# Patient Record
Sex: Male | Born: 1940
Health system: Southern US, Community
[De-identification: ages and names within clinical notes are randomized; demographics above are authoritative.]

## PROBLEM LIST (undated history)

## (undated) DIAGNOSIS — M1990 Unspecified osteoarthritis, unspecified site: Secondary | ICD-10-CM

## (undated) DIAGNOSIS — Z9289 Personal history of other medical treatment: Secondary | ICD-10-CM

## (undated) DIAGNOSIS — K5790 Diverticulosis of intestine, part unspecified, without perforation or abscess without bleeding: Secondary | ICD-10-CM

## (undated) DIAGNOSIS — E785 Hyperlipidemia, unspecified: Secondary | ICD-10-CM

## (undated) DIAGNOSIS — D619 Aplastic anemia, unspecified: Secondary | ICD-10-CM

## (undated) DIAGNOSIS — N402 Nodular prostate without lower urinary tract symptoms: Secondary | ICD-10-CM

## (undated) DIAGNOSIS — D595 Paroxysmal nocturnal hemoglobinuria [Marchiafava-Micheli]: Secondary | ICD-10-CM

## (undated) HISTORY — PX: COLONOSCOPY: SHX174

## (undated) HISTORY — PX: APPENDECTOMY: SHX54

## (undated) HISTORY — DX: Hyperlipidemia, unspecified: E78.5

## (undated) HISTORY — PX: TOTAL HIP ARTHROPLASTY: SHX124

## (undated) HISTORY — PX: JOINT REPLACEMENT: SHX530

## (undated) HISTORY — PX: SHOULDER ARTHROSCOPY: SHX128

## (undated) HISTORY — DX: Nodular prostate without lower urinary tract symptoms: N40.2

## (undated) HISTORY — PX: TONSILLECTOMY: SUR1361

## (undated) HISTORY — PX: KNEE SURGERY: SHX244

---

## 2003-03-17 ENCOUNTER — Emergency Department (HOSPITAL_COMMUNITY): Admission: EM | Admit: 2003-03-17 | Discharge: 2003-03-17 | Payer: Self-pay | Admitting: Emergency Medicine

## 2003-07-15 ENCOUNTER — Encounter: Payer: Self-pay | Admitting: Orthopedic Surgery

## 2003-07-18 ENCOUNTER — Inpatient Hospital Stay (HOSPITAL_COMMUNITY): Admission: RE | Admit: 2003-07-18 | Discharge: 2003-07-21 | Payer: Self-pay | Admitting: Orthopedic Surgery

## 2003-07-18 ENCOUNTER — Encounter: Payer: Self-pay | Admitting: Orthopedic Surgery

## 2003-08-02 ENCOUNTER — Ambulatory Visit: Admission: RE | Admit: 2003-08-02 | Discharge: 2003-08-02 | Payer: Self-pay | Admitting: Orthopedic Surgery

## 2003-10-02 ENCOUNTER — Ambulatory Visit (HOSPITAL_COMMUNITY): Admission: RE | Admit: 2003-10-02 | Discharge: 2003-10-02 | Payer: Self-pay | Admitting: Orthopedic Surgery

## 2003-10-10 ENCOUNTER — Encounter: Admission: RE | Admit: 2003-10-10 | Discharge: 2003-10-10 | Payer: Self-pay | Admitting: Orthopedic Surgery

## 2003-10-23 ENCOUNTER — Encounter: Admission: RE | Admit: 2003-10-23 | Discharge: 2003-10-23 | Payer: Self-pay | Admitting: Orthopedic Surgery

## 2003-11-12 ENCOUNTER — Encounter: Admission: RE | Admit: 2003-11-12 | Discharge: 2003-11-12 | Payer: Self-pay | Admitting: Orthopedic Surgery

## 2004-05-26 ENCOUNTER — Inpatient Hospital Stay (HOSPITAL_COMMUNITY): Admission: RE | Admit: 2004-05-26 | Discharge: 2004-05-30 | Payer: Self-pay | Admitting: Orthopedic Surgery

## 2004-10-13 ENCOUNTER — Inpatient Hospital Stay (HOSPITAL_COMMUNITY): Admission: RE | Admit: 2004-10-13 | Discharge: 2004-10-17 | Payer: Self-pay | Admitting: Orthopedic Surgery

## 2004-10-14 ENCOUNTER — Ambulatory Visit: Payer: Self-pay | Admitting: Physical Medicine & Rehabilitation

## 2008-12-21 ENCOUNTER — Emergency Department (HOSPITAL_COMMUNITY): Admission: EM | Admit: 2008-12-21 | Discharge: 2008-12-21 | Payer: Self-pay | Admitting: Emergency Medicine

## 2009-02-21 ENCOUNTER — Encounter: Payer: Self-pay | Admitting: Internal Medicine

## 2009-03-28 ENCOUNTER — Encounter: Admission: RE | Admit: 2009-03-28 | Discharge: 2009-03-28 | Payer: Self-pay | Admitting: Family Medicine

## 2009-05-22 ENCOUNTER — Ambulatory Visit: Payer: Self-pay | Admitting: Internal Medicine

## 2009-05-22 DIAGNOSIS — J4489 Other specified chronic obstructive pulmonary disease: Secondary | ICD-10-CM | POA: Insufficient documentation

## 2009-05-22 DIAGNOSIS — J449 Chronic obstructive pulmonary disease, unspecified: Secondary | ICD-10-CM

## 2009-05-22 DIAGNOSIS — E785 Hyperlipidemia, unspecified: Secondary | ICD-10-CM

## 2011-02-26 NOTE — Discharge Summary (Signed)
NAMEJAYAN, [name] NO.:  [id number]   MEDICAL RECORD NO.:  [id number]                   PATIENT TYPE:  INP   LOCATION:  5019                                 FACILITY:  [facility]   PHYSICIAN:  Burnard Bunting, M.D.                 DATE OF BIRTH:  01/21/1941   DATE OF ADMISSION:  07/18/2003  DATE OF DISCHARGE:  07/21/2003                                 DISCHARGE SUMMARY   DISCHARGE DIAGNOSIS:  Right knee arthritis.   SECONDARY DIAGNOSIS:  None.   OPERATION/PROCEDURE:  Right total knee replacement.   HOSPITAL COURSE:  Vincent Jensen is a 70 year old patient with right knee  arthritis who underwent a right total knee replacement on July 18, 2003.  The patient tolerated the procedure well without immediate complications.  He was noted to have intact neurovascular function to the right foot post  procedure.  The patient was started on Coumadin for DVT prophylaxis and he  was started on physical therapy and CPM machine for mobilization.  Postoperative radiographs showed good position and alignment of components.  Hematocrit on postop day #3 was 28.6.  The patient made excellent progress  and was discharged home, July 21, 2003, in good condition.   FOLLOWUP:  He will follow up with me in one week.   DISCHARGE INSTRUCTIONS:  He will be weightbearing as tolerated and will keep  the incision dry.   DISCHARGE MEDICATIONS:  Discharge medications include Coumadin and Percocet,  both preadmission medications.                                                Burnard Bunting, M.D.    GSD/MEDQ  D:  08/27/2003  T:  08/27/2003  Job:  [id number]

## 2011-02-26 NOTE — Op Note (Signed)
NAMEKIMBALL, APPLEBY NO.:  [id number]   MEDICAL RECORD NO.:  [id number]                   PATIENT TYPE:  INP   LOCATION:  5030                                 FACILITY:  [facility]   PHYSICIAN:  Burnard Bunting, M.D.                 DATE OF BIRTH:  1940/11/02   DATE OF PROCEDURE:  05/26/2004  DATE OF DISCHARGE:  05/30/2004                                 OPERATIVE REPORT   PREOPERATIVE DIAGNOSIS:  Right hip arthritis.   POSTOPERATIVE DIAGNOSIS:  Right hip arthritis.   PROCEDURE:  Right total hip replacement.   SURGEON:  Burnard Bunting, M.D.   ASSISTANT:  Jerolyn Shin. Tresa Res, M.D.   ANESTHESIA:  General endotracheal anesthesia.   ESTIMATED BLOOD LOSS:  600 mL.   COMPONENTS:  Accolade size 4 femur, +12 head, 54 mm cup, with 32 mm head and  Crossfire poly.   PROCEDURE IN DETAIL:  The patient was brought to the operating room where  general endotracheal anesthesia was induced.  Preoperative antibiotics were  administered.  The patient was placed in the lateral decubitus position with  the left axilla and left peroneal nerve well padded.  The patient's right  hip, leg, and foot were prepped with DuraPrep solution and draped in a  sterile manner.  The operative field was covered with Ioban.  An incision  was made over the greater trochanter.  The skin and subcutaneous tissue were  sharply divided.  The fascia lata was split over the trochanter.  The  sciatic nerve was palpated and protected at all times during the remaining  portion of the case.  The gluteus maximus muscle was split bluntly along the  direction of its fibers.  The piriformis tendon was tagged.  The external  rotators were removed from the capsule.  The capsule was then split and  tagged with #1 Ethibond suture.  The hip was dislocated at this time.  The  neck was then cut with the reciprocating saw.  The acetabular labrum was  excised, the anterior acetabular retractors were placed.  The  acetabulum was  reamed in approximately 45 degrees of abduction and 20 degrees of  anteversion.  A trial 0 degree liner was placed.  Excellent press fit was  achieved with the cup.  At this time, the femur was prepared by broaching  with an Accolade broach and then reduction was performed with a size 1 in  position.  Anteversion was subsequently changed to increase posterior  instability at that time.  Following change of anteversion and retrialing  with a size 2 Accolade prosthesis, the patient was noted to have stability  in full extension with external rotation and 90 degrees of hip flexion and  60 degrees of internal rotation.  The trial liner was removed and broaching  to a size 4 was performed.  The ceramic cup was then placed into position  and then the  true stem was placed.  With the stem in position, stability  parameters changed with increased anterior instability in external rotation  and extension.  A 10 degree liner was placed and a longer neck length was  chosen.  With a +12 head and a 10 degree liner, excellent stability was  achieved with external rotation, full extension, and anterior force.  At  this time, a 10 mm poly liner was placed and a +12 head was placed onto the  stem of the femur.  Good reduction parameters were maintained.  At this  time, the incision was thoroughly irrigated, bleeding points were accounted  for using electrocautery.  The posterior capsular split was closed using #1  Ethibond suture.  The sciatic nerve was again palpated and found to be  intact.  Interoperative x-rays demonstrated good fill of the canal and good  component position and restoration of leg lengths where the right leg was  shorter than the left.  The fascia lata was closed using #1 Vicryl suture.  The skin was closed  using interrupted inverted 2-0 Vicryl suture and skin staples.  An  impervious dressing was placed.  The patient tolerated the procedure well  without immediate  complications.                                               Burnard Bunting, M.D.    GSD/MEDQ  D:  06/15/2004  T:  06/15/2004  Job:  [id number]

## 2011-02-26 NOTE — Discharge Summary (Signed)
[name], [name] NO.:  [id number]   MEDICAL RECORD NO.:  [id number]          PATIENT TYPE:  INP   LOCATION:  5032                         FACILITY:  [facility]   PHYSICIAN:  Burnard Bunting, M.D.    DATE OF BIRTH:  Nov 27, 1940   DATE OF ADMISSION:  10/13/2004  DATE OF DISCHARGE:  10/17/2004                                 DISCHARGE SUMMARY   DISCHARGE DIAGNOSES:  Left hip arthritis   SECONDARY DIAGNOSIS:  History of bilateral knee replacement and right hip  arthritis with right hip replacement.   OPERATIONS/PROCEDURES/TREATMENT:  Left hip replacement performed on October 13, 2004.   HOSPITAL COURSE:  Vincent Jensen is a 70 year old patient with left hip  arthritis. He underwent left total replacement on October 13, 2004. The  patient was started on Coumadin for deep vein thrombosis prophylaxis.  Postoperative neurovascular function was intact in the left lower extremity.  Leg lengths were approximately equal. Coumadin was started for deep vein  thrombosis prophylaxis. The patient was started with physical therapy,  mobilizing weight bearing as tolerated. Had an unremarkable recovery.  Incision was intact on postoperative day 2.   DISPOSITION:  The patient is discharged home in good condition with Percocet  and Robaxin as well as Coumadin for discharge medications.   FOLLOW UP:  He will followup with me in a week for suture removal.      GSD/MEDQ  D:  12/18/2004  T:  12/20/2004  Job:  [id number]

## 2011-02-26 NOTE — Discharge Summary (Signed)
NAMEPATTY, LOPEZGARCIA NO.:  [id number]   MEDICAL RECORD NO.:  [id number]          PATIENT TYPE:  INP   LOCATION:  5030                         FACILITY:  [facility]   PHYSICIAN:  Burnard Bunting, M.D.    DATE OF BIRTH:  [birth year]   DATE OF ADMISSION:  05/26/2004  DATE OF DISCHARGE:  05/30/2004                                 DISCHARGE SUMMARY   DISCHARGE DIAGNOSES:  Right hip arthritis.   SECONDARY DIAGNOSES:  History of deep venous thrombosis.   OPERATION/PROCEDURE:  Right hip replacement.   HISTORY OF PRESENT ILLNESS:  Carson Mceuen is a 70 year old patient with  end-stage arthritis of his right hip.  He underwent right total hip  replacement May 26, 2004.  Postoperative following the procedure patient  was noted to have equal leg lengths and intact dorsiflexion and plantar  flexion.  He was mobilized with physical therapy.  His leg lengths were  equal.  Hematocrit was 27 on May 28, 2004.  He was started on Coumadin  for DVT prophylaxis.  INR was 1.4 on postoperative day #2.  Patient had  otherwise uneventful recovery.  He mobilized well with physical therapy.  Incision was intact on postoperative day #3.  He had no calf tenderness.  He  was continued on Lovenox and Coumadin until his Coumadin was therapeutic.  He had an otherwise uneventful recovery.  He is discharged home in good  condition.  Will follow up with me in seven days for suture removal.   DISCHARGE MEDICATIONS:  1.  Lovenox.  2.  Coumadin.  3.  Robaxin.  4.  Percocet for pain.  5.  Preoperative medications.       GSD/MEDQ  D:  07/15/2004  T:  07/15/2004  Job:  [id number]

## 2011-02-26 NOTE — Op Note (Signed)
Vincent Jensen, Vincent Jensen NO.:  [id number]   MEDICAL RECORD NO.:  [id number]                   PATIENT TYPE:  INP   LOCATION:  5019                                 FACILITY:  [facility]   PHYSICIAN:  Burnard Bunting, M.D.                 DATE OF BIRTH:  28-Dec-1940   DATE OF PROCEDURE:  07/18/2003  DATE OF DISCHARGE:                                 OPERATIVE REPORT   PREOPERATIVE DIAGNOSIS:  Right knee arthritis.   POSTOPERATIVE DIAGNOSIS:  Right knee arthritis.   OPERATION PERFORMED:  Right total knee arthroplasty.   SURGEON:  Burnard Bunting, M.D.   ASSISTANT:  Jerolyn Shin. Tresa Res, M.D.   ANESTHESIA:  Epidural.   ESTIMATED BLOOD LOSS:  .   DRAINS:  Hemovac times one.   TOURNIQUET TIME:  125 minutes at .   DESCRIPTION OF PROCEDURE:  The patient was brought to the operating room  where epidural anesthesia was induced.  Preoperative intravenous antibiotics  were administered.  The right leg including the foot was prepped with  DuraPrep solution and draped in a sterile manner.  Vincent Jensen was used to cover  the operative field.  The patient had a prior medial meniscectomy incision.  This was incorporated into the anterior approach to the knee.  The leg was  elevated and exsanguinated with the Esmarch wrap.  Tourniquet was inflated.  A curvilinear anterior incision was made, curving on the medial aspect of  the patella to incorporate the entire incision. The skin and subcutaneous  tissue were sharply divided.  Median parapatellar arthrotomy was made.  The  precise location of this arthrotomy was marked with a #1 Vicryl suture.  The  patella was everted.  The fat pad was excised.  The lateral patellofemoral  ligament was resected.  The tissue on the anterior superior distal aspect of  the femoral shaft was removed.  Osteophytes were also removed.  At this time  the ACL and PCL were released.  Anterior horns of the medial and lateral  menisci were also  released.  The medial sleeve of the soft tissue including  superficial MCL was released back to the semimembranosus bursa.  At this  time the intramedullary alignment was placed into the femur.  A 12 mm  resection was made off of the femoral condyle in 5 degrees of valgus.  The  cutting block was then applied, parallel to the transepicondylar axis.  The  anterior, posterior and Chamfer cuts were then made to a size 9.  At this  time the tibia was prepared.  The PCL was resected.  The intramedullary  alignment was used on the tibial shaft.  A 12 mm resection was made off of  the least affected lateral tibial plateau.  This cut approximately 90% of  the medial tibial plateau.  There was an area that was 5 mm by 1 cm which  was eburnated bone  which was not cut.  It was on the posteromedial aspect of  the tibia.  Holes were drilled into this hardened bone for cement  penetration.  Following this, resection of the tibia, the box cut was made  on the femur, posterior capsular release was performed using a blunt Cobb  elevator.  The patella was then cut in free hand fashion and sized to a size  7.  Trial components were then placed including a 10 spacer.  The knee  achieved about 3 degrees of hyperextension, full flexion with no lift off  and excellent patella tracking using no thumbs technique.  Trial components  were removed.  The cut bony surfaces were irrigated and then the components  including a size 9 femur, size 9 tibial, 7 patella and 10 mm spacer were  placed.  Excess cement was removed.  Cement hardening was allowed to occur.  Range of motion, stability and alignment were all satisfactory.  At this  time the true 10 spacer was placed.  The patient tolerated the procedure  well without immediate complications. The tourniquet was released.  Bleeding  points encountered were controlled using electrocautery.  The arthrotomy was  closed over a Hemovac drain using interrupted inverted #1  Vicryl suture,  followed by interrupted inverted 2-0 Vicryl suture and skin staples.  The  Hemovac drain was clamped and 20 mL of 0.25% Marcaine with epinephrine, 8 mg  of morphine and 0.45mL of clonidine were injected into the knee.  The  patient was then placed into a bulky dressing and knee immobilizer.  The  patient tolerated the procedure well without immediate complications.                                               Burnard Bunting, M.D.    GSD/MEDQ  D:  07/18/2003  T:  07/18/2003  Job:  [id number]

## 2011-02-26 NOTE — Op Note (Signed)
Vincent Jensen, Vincent Jensen NO.:  [id number]   MEDICAL RECORD NO.:  [id number]          PATIENT TYPE:  INP   LOCATION:  5032                         FACILITY:  [facility]   PHYSICIAN:  Burnard Bunting, M.D.    DATE OF BIRTH:  11-Oct-1941   DATE OF PROCEDURE:  10/13/2004  DATE OF DISCHARGE:  10/17/2004                                 OPERATIVE REPORT   PREOPERATIVE DIAGNOSIS:  Left hip arthritis.   POSTOPERATIVE DIAGNOSIS:  Left hip arthritis.   PROCEDURE:  Left total hip replacement.   SURGEON:  Burnard Bunting, M.D.   ASSISTANT:  Jerolyn Shin. Tresa Res, M.D.   ANESTHESIA:  General endotracheal.   ESTIMATED BLOOD LOSS:  500 mL.   DRAINS:  None.   COMPONENTS:  Accolade press-fit stem, press-fit cup, Crossfire polyethylene  liner, cobalt chrome head.   DESCRIPTION OF PROCEDURE:  The patient was brought to the operating room  where general endotracheal anesthesia was induced, preoperative IV  antibiotics were administered.  The patient was placed in the lateral  decubitus position with the left hip facing up.  Peroneal nerve and axilla  were well padded. The left leg, hip, foot and ankle were prepped with  DuraPrep solution and draped in a sterile manner.  [name] was used to cover  the operative field.  The anatomy of the hip was palpated, including the  posterior, anterior and lateral margins.  Skin and subcutaneous tissues were  sharply divided, fascia lata was encountered and divided distally and  proximally. Gluteus maximus muscle fibers were split longitudinally.  Bleeding points encountered and controlled using electrocautery.  Sciatic  nerve was palpated at this time and protected at all times during the  remaining portion of the case.   Charnley retractor was placed, femoris tendon was tagged and retracted,  external rotators were then peeled off the capsule and retracted  posteriorly.  The capsule was then T'd and tagged. Using a broach template,  the femoral neck  cut was performed, with an oscillating saw approximately 1  fingerbreadth to the lesser tuberosity according to preoperative templating.  Lateral starting point for reaming was obtained.   At this time, the intra-acetabular retractor was placed, the labrum was  incised, fovea was excised, reaming was performed to approximately 45  degrees of abduction and 25 degrees of hip anteversion.  Once solid, full  rim contact was obtained, the trial liner was placed and the femur was then  broached.  Intraoperative x-rays demonstrated good canal fill with the  broach.  Reduction was then performed.  Good stability was achieved in full  extension and external rotation, position __________ as well as 90 degrees  of hip flexion, 10 degrees of abduction, and 60 degrees of internal  rotation.   At this time, trial components were removed, true components were then  placed, beginning with a press-fit cup with excellent purchase obtained. A  10 degrees liner was then placed.  Accolade stem was placed and a +8 neck  was required in order to maximize tissue, off-set tension for maximum  stability. Stability parameters remained intact.  At this time,  incision was  thoroughly irrigated with pulse lavage solution. Sciatic nerve was again  palpated and found to be intact.  Capsule was closed using #1 Ethibond  suture.  Piriformis tendon was tagged back to the capsule with free needle.  Hemovac drain was placed. Fascia lata was closed using interrupted unfolded  #1 Vicryl suture, followed by interrupted, inverted 2-0 Vicryl suture and  skin staples to reapproximate the skin edges.  Impervious dressing was  placed. The patient had equal leg lengths in dorsiflexion at the end of the  case.       GSD/MEDQ  D:  11/01/2004  T:  11/01/2004  Job:  [id number]

## 2011-08-12 DIAGNOSIS — D595 Paroxysmal nocturnal hemoglobinuria [Marchiafava-Micheli]: Secondary | ICD-10-CM | POA: Insufficient documentation

## 2011-09-29 DIAGNOSIS — D619 Aplastic anemia, unspecified: Secondary | ICD-10-CM | POA: Insufficient documentation

## 2011-10-13 DIAGNOSIS — D596 Hemoglobinuria due to hemolysis from other external causes: Secondary | ICD-10-CM | POA: Diagnosis not present

## 2011-10-13 DIAGNOSIS — R51 Headache: Secondary | ICD-10-CM | POA: Diagnosis present

## 2011-10-13 DIAGNOSIS — Z96659 Presence of unspecified artificial knee joint: Secondary | ICD-10-CM | POA: Diagnosis not present

## 2011-10-13 DIAGNOSIS — D6189 Other specified aplastic anemias and other bone marrow failure syndromes: Secondary | ICD-10-CM | POA: Diagnosis present

## 2011-10-13 DIAGNOSIS — D619 Aplastic anemia, unspecified: Secondary | ICD-10-CM | POA: Diagnosis not present

## 2011-10-19 DIAGNOSIS — D61818 Other pancytopenia: Secondary | ICD-10-CM | POA: Diagnosis not present

## 2011-10-21 DIAGNOSIS — D619 Aplastic anemia, unspecified: Secondary | ICD-10-CM | POA: Diagnosis not present

## 2011-10-21 DIAGNOSIS — D61818 Other pancytopenia: Secondary | ICD-10-CM | POA: Diagnosis not present

## 2011-10-25 DIAGNOSIS — D619 Aplastic anemia, unspecified: Secondary | ICD-10-CM | POA: Diagnosis not present

## 2011-10-25 DIAGNOSIS — D61818 Other pancytopenia: Secondary | ICD-10-CM | POA: Diagnosis not present

## 2011-10-28 DIAGNOSIS — D619 Aplastic anemia, unspecified: Secondary | ICD-10-CM | POA: Diagnosis not present

## 2011-10-28 DIAGNOSIS — D61818 Other pancytopenia: Secondary | ICD-10-CM | POA: Diagnosis not present

## 2011-10-28 DIAGNOSIS — D596 Hemoglobinuria due to hemolysis from other external causes: Secondary | ICD-10-CM | POA: Diagnosis not present

## 2011-11-03 DIAGNOSIS — D61818 Other pancytopenia: Secondary | ICD-10-CM | POA: Diagnosis not present

## 2011-11-03 DIAGNOSIS — D619 Aplastic anemia, unspecified: Secondary | ICD-10-CM | POA: Diagnosis not present

## 2011-11-03 DIAGNOSIS — D596 Hemoglobinuria due to hemolysis from other external causes: Secondary | ICD-10-CM | POA: Diagnosis not present

## 2011-11-09 DIAGNOSIS — D61818 Other pancytopenia: Secondary | ICD-10-CM | POA: Diagnosis not present

## 2011-11-09 DIAGNOSIS — D596 Hemoglobinuria due to hemolysis from other external causes: Secondary | ICD-10-CM | POA: Diagnosis not present

## 2011-11-15 DIAGNOSIS — D596 Hemoglobinuria due to hemolysis from other external causes: Secondary | ICD-10-CM | POA: Diagnosis not present

## 2011-11-22 DIAGNOSIS — D596 Hemoglobinuria due to hemolysis from other external causes: Secondary | ICD-10-CM | POA: Diagnosis not present

## 2011-11-30 DIAGNOSIS — D596 Hemoglobinuria due to hemolysis from other external causes: Secondary | ICD-10-CM | POA: Diagnosis not present

## 2011-12-06 DIAGNOSIS — D596 Hemoglobinuria due to hemolysis from other external causes: Secondary | ICD-10-CM | POA: Diagnosis not present

## 2011-12-07 ENCOUNTER — Other Ambulatory Visit: Payer: Self-pay

## 2011-12-07 ENCOUNTER — Encounter (HOSPITAL_COMMUNITY): Payer: Self-pay | Admitting: *Deleted

## 2011-12-07 ENCOUNTER — Observation Stay (HOSPITAL_COMMUNITY)
Admission: EM | Admit: 2011-12-07 | Discharge: 2011-12-08 | Disposition: A | Discharge status: Home / Self Care | Payer: 59 | Attending: Family Medicine | Admitting: Family Medicine

## 2011-12-07 ENCOUNTER — Emergency Department (HOSPITAL_COMMUNITY): Payer: 59

## 2011-12-07 DIAGNOSIS — M47812 Spondylosis without myelopathy or radiculopathy, cervical region: Secondary | ICD-10-CM | POA: Insufficient documentation

## 2011-12-07 DIAGNOSIS — R079 Chest pain, unspecified: Secondary | ICD-10-CM | POA: Diagnosis not present

## 2011-12-07 DIAGNOSIS — E785 Hyperlipidemia, unspecified: Secondary | ICD-10-CM | POA: Insufficient documentation

## 2011-12-07 DIAGNOSIS — D6189 Other specified aplastic anemias and other bone marrow failure syndromes: Secondary | ICD-10-CM | POA: Insufficient documentation

## 2011-12-07 DIAGNOSIS — M25519 Pain in unspecified shoulder: Secondary | ICD-10-CM | POA: Insufficient documentation

## 2011-12-07 DIAGNOSIS — R0789 Other chest pain: Principal | ICD-10-CM | POA: Insufficient documentation

## 2011-12-07 HISTORY — DX: Aplastic anemia, unspecified: D61.9

## 2011-12-07 HISTORY — DX: Unspecified osteoarthritis, unspecified site: M19.90

## 2011-12-07 HISTORY — DX: Paroxysmal nocturnal hemoglobinuria (Marchiafava-Micheli): D59.5

## 2011-12-07 HISTORY — DX: Diverticulosis of intestine, part unspecified, without perforation or abscess without bleeding: K57.90

## 2011-12-07 MED ORDER — IBUPROFEN 200 MG PO TABS: 600.0000 mg | ORAL_TABLET | Freq: Once | ORAL | Status: DC

## 2011-12-07 MED ORDER — IBUPROFEN 200 MG PO TABS
ORAL_TABLET | ORAL | Status: AC
  Filled 2011-12-07: qty 3

## 2011-12-07 NOTE — ED Notes (Signed)
Pt referred by oncologist for ongoing CP/shoulder pain.  Taken to EKG room by this RN.

## 2011-12-07 NOTE — ED Notes (Addendum)
C/o R pectoral muscle pain, R chest & R shoulder), worse with movement and turning, guarding movement, relates to moving a large oriental rug on Sunday, pain onset on Sunday, denies other sx, has some chronic sx r/t to BNP that makes it difficult to differentiate b/w chronic and new dizziness. Alert, NAD, calm, interactive,skin W&D, resps e/u, speaking in clear complete sentences.  Bilateral grip strength and radial pulses are equal and strong.

## 2011-12-08 ENCOUNTER — Telehealth: Payer: Self-pay | Admitting: *Deleted

## 2011-12-08 ENCOUNTER — Emergency Department (HOSPITAL_COMMUNITY): Payer: 59

## 2011-12-08 ENCOUNTER — Encounter (HOSPITAL_COMMUNITY): Payer: Self-pay | Admitting: Radiology

## 2011-12-08 DIAGNOSIS — M5382 Other specified dorsopathies, cervical region: Secondary | ICD-10-CM

## 2011-12-08 DIAGNOSIS — D619 Aplastic anemia, unspecified: Secondary | ICD-10-CM | POA: Diagnosis not present

## 2011-12-08 DIAGNOSIS — R0789 Other chest pain: Secondary | ICD-10-CM

## 2011-12-08 DIAGNOSIS — D596 Hemoglobinuria due to hemolysis from other external causes: Secondary | ICD-10-CM | POA: Diagnosis not present

## 2011-12-08 LAB — CBC
Hemoglobin: 9.2 g/dL — ABNORMAL LOW (ref 13.0–17.0)
Hemoglobin: 9.3 g/dL — ABNORMAL LOW (ref 13.0–17.0)
MCH: 30.9 pg (ref 26.0–34.0)
MCH: 31.4 pg (ref 26.0–34.0)
MCHC: 33.6 g/dL (ref 30.0–36.0)
MCV: 92 fL (ref 78.0–100.0)
Platelets: 113 10*3/uL — ABNORMAL LOW (ref 150–400)
Platelets: 115 10*3/uL — ABNORMAL LOW (ref 150–400)
RBC: 2.93 MIL/uL — ABNORMAL LOW (ref 4.22–5.81)
RBC: 3.01 MIL/uL — ABNORMAL LOW (ref 4.22–5.81)
WBC: 6 10*3/uL (ref 4.0–10.5)
WBC: 7.6 10*3/uL (ref 4.0–10.5)

## 2011-12-08 LAB — COMPREHENSIVE METABOLIC PANEL
AST: 19 U/L (ref 0–37)
Albumin: 3.2 g/dL — ABNORMAL LOW (ref 3.5–5.2)
BUN: 24 mg/dL — ABNORMAL HIGH (ref 6–23)
Calcium: 9.5 mg/dL (ref 8.4–10.5)
Chloride: 100 mEq/L (ref 96–112)

## 2011-12-08 LAB — CARDIAC PANEL(CRET KIN+CKTOT+MB+TROPI)
CK, MB: 2.1 ng/mL (ref 0.3–4.0)
Relative Index: INVALID (ref 0.0–2.5)
Total CK: 55 U/L (ref 7–232)
Troponin I: <0.3 ng/mL (ref <0.30)

## 2011-12-08 LAB — POCT I-STAT, CHEM 8
Chloride: 103 mEq/L (ref 96–112)
Creatinine, Ser: 1 mg/dL (ref 0.50–1.35)
Glucose, Bld: 144 mg/dL — ABNORMAL HIGH (ref 70–99)
Potassium: 4.3 mEq/L (ref 3.5–5.1)

## 2011-12-08 LAB — CREATININE, SERUM
Creatinine, Ser: 0.84 mg/dL (ref 0.50–1.35)
GFR calc Af Amer: >90 mL/min (ref >90)
GFR calc non Af Amer: 87 mL/min — ABNORMAL LOW (ref >90)

## 2011-12-08 LAB — POCT I-STAT TROPONIN I

## 2011-12-08 LAB — HEMOGLOBIN A1C: Hgb A1c MFr Bld: 6 % — ABNORMAL HIGH (ref <5.7)

## 2011-12-08 MED ORDER — OXYCODONE HCL 5 MG PO CAPS: 5.0000 mg | ORAL_CAPSULE | Freq: Four times a day (QID) | ORAL | Status: AC | PRN

## 2011-12-08 MED ORDER — DIAZEPAM 5 MG PO TABS
5.0000 mg | ORAL_TABLET | Freq: Once | ORAL | Status: AC
  Administered 2011-12-08: 5 mg via ORAL
  Filled 2011-12-08: qty 1

## 2011-12-08 MED ORDER — ASPIRIN 300 MG RE SUPP
300.0000 mg | RECTAL | Status: DC
  Filled 2011-12-08: qty 1

## 2011-12-08 MED ORDER — FENTANYL CITRATE 0.05 MG/ML IJ SOLN
50.0000 ug | Freq: Once | INTRAMUSCULAR | Status: AC
  Administered 2011-12-08: 50 ug via INTRAVENOUS
  Filled 2011-12-08: qty 2

## 2011-12-08 MED ORDER — HYDROMORPHONE HCL PF 1 MG/ML IJ SOLN
1.0000 mg | Freq: Once | INTRAMUSCULAR | Status: AC
Start: 2011-12-08 — End: 2011-12-08
  Administered 2011-12-08: 1 mg via INTRAVENOUS
  Filled 2011-12-08: qty 1

## 2011-12-08 MED ORDER — ACETAMINOPHEN 325 MG PO TABS: 650.0000 mg | ORAL_TABLET | ORAL | Status: DC | PRN

## 2011-12-08 MED ORDER — TRAMADOL HCL 50 MG PO TABS: 100.0000 mg | ORAL_TABLET | Freq: Four times a day (QID) | ORAL | Status: DC | PRN

## 2011-12-08 MED ORDER — ASPIRIN 81 MG PO CHEW
324.0000 mg | CHEWABLE_TABLET | Freq: Once | ORAL | Status: AC
  Administered 2011-12-08: 324 mg via ORAL
  Filled 2011-12-08: qty 4

## 2011-12-08 MED ORDER — NITROGLYCERIN 0.4 MG SL SUBL: 0.4000 mg | SUBLINGUAL_TABLET | SUBLINGUAL | Status: DC | PRN

## 2011-12-08 MED ORDER — SODIUM CHLORIDE 0.9 % IJ SOLN: 3.0000 mL | Freq: Two times a day (BID) | INTRAMUSCULAR | Status: DC

## 2011-12-08 MED ORDER — ADULT MULTIVITAMIN W/MINERALS CH
1.0000 | ORAL_TABLET | Freq: Every day | ORAL | Status: DC
  Administered 2011-12-08: 1 via ORAL
  Filled 2011-12-08: qty 1

## 2011-12-08 MED ORDER — MORPHINE SULFATE 4 MG/ML IJ SOLN
4.0000 mg | INTRAMUSCULAR | Status: DC | PRN
  Administered 2011-12-08: 4 mg via INTRAVENOUS
  Filled 2011-12-08: qty 1

## 2011-12-08 MED ORDER — ASPIRIN EC 81 MG PO TBEC: 81.0000 mg | DELAYED_RELEASE_TABLET | Freq: Every day | ORAL | Status: DC

## 2011-12-08 MED ORDER — ONDANSETRON HCL 4 MG/2ML IJ SOLN
4.0000 mg | Freq: Once | INTRAMUSCULAR | Status: AC
  Administered 2011-12-08: 4 mg via INTRAVENOUS
  Filled 2011-12-08: qty 2

## 2011-12-08 MED ORDER — SODIUM CHLORIDE 0.9 % IJ SOLN: 3.0000 mL | INTRAMUSCULAR | Status: DC | PRN

## 2011-12-08 MED ORDER — ENOXAPARIN SODIUM 40 MG/0.4ML ~~LOC~~ SOLN
40.0000 mg | Freq: Every day | SUBCUTANEOUS | Status: DC
  Filled 2011-12-08: qty 0.4

## 2011-12-08 MED ORDER — ASPIRIN 81 MG PO CHEW: 324.0000 mg | CHEWABLE_TABLET | ORAL | Status: DC

## 2011-12-08 MED ORDER — OXYMETAZOLINE HCL 0.05 % NA SOLN
2.0000 | Freq: Two times a day (BID) | NASAL | Status: DC | PRN
  Filled 2011-12-08: qty 15

## 2011-12-08 MED ORDER — IOHEXOL 350 MG/ML SOLN
100.0000 mL | Freq: Once | INTRAVENOUS | Status: AC | PRN
  Administered 2011-12-08: 100 mL via INTRAVENOUS

## 2011-12-08 MED ORDER — FOLIC ACID 800 MCG PO TABS: 400.0000 ug | ORAL_TABLET | Freq: Two times a day (BID) | ORAL | Status: DC

## 2011-12-08 MED ORDER — ONDANSETRON HCL 4 MG/2ML IJ SOLN: 4.0000 mg | Freq: Four times a day (QID) | INTRAMUSCULAR | Status: DC | PRN

## 2011-12-08 MED ORDER — B COMPLEX-C PO TABS
1.0000 | ORAL_TABLET | Freq: Every day | ORAL | Status: DC
  Administered 2011-12-08: 1 via ORAL
  Filled 2011-12-08: qty 1

## 2011-12-08 MED ORDER — FOLIC ACID 0.5 MG HALF TAB
0.5000 mg | ORAL_TABLET | Freq: Two times a day (BID) | ORAL | Status: DC
  Administered 2011-12-08: 0.5 mg via ORAL
  Filled 2011-12-08 (×2): qty 1

## 2011-12-08 MED ORDER — CYCLOSPORINE 100 MG PO CAPS
200.0000 mg | ORAL_CAPSULE | Freq: Two times a day (BID) | ORAL | Status: DC
  Administered 2011-12-08: 200 mg via ORAL
  Filled 2011-12-08 (×2): qty 2

## 2011-12-08 MED ORDER — SODIUM CHLORIDE 0.9 % IV SOLN: 250.0000 mL | INTRAVENOUS | Status: DC | PRN

## 2011-12-08 NOTE — Telephone Encounter (Signed)
Received call from [facility].  Patient was d/c'd from hospital by Drs. McDiarmid & Tye Savoy.  Stress test was scheduled with their office for 12/10/11.  Patient has [facility] and will require prior authorization from ordering MD's office.  Copy of insurance card was faxed to our office to get approval.  Will also need stress test order faxed to [phone].  Prior authorization may need to be obtained from patient's PCP.  Left message with Dr. Wendee Copp office to call our office back.  Gaylene Brooks, RN

## 2011-12-08 NOTE — Telephone Encounter (Signed)
Deedie @ [facility] called back.  Patient canceled appt for stress test on 12/10/11 due to "having a whole lot going on at work."  Patient will call back to  reschedule appt later.  Gaylene Brooks, RN

## 2011-12-08 NOTE — ED Notes (Addendum)
Called and gave report to [place].

## 2011-12-08 NOTE — Discharge Summary (Signed)
Physician Discharge Summary  Patient ID: Vincent Jensen [id number] [birth year] 71 y.o.  Admit date: 12/07/2011 Discharge date: 12/08/2011  PCP: Dois Davenport., MD, MD   Discharge Diagnosis: 1. Chest, shoulder neck and rib pain secondary to moving furniture. 2. PNH 3. Hx diverticulosis 4. Hx arthritis.  5. HLD   Discharge Medications  Lukasz, Rogus  Home Medication Instructions [id number]   Printed on:12/08/11 1000  Medication Information                    cycloSPORINE (SANDIMMUNE) 100 MG capsule Take 200 mg by mouth 2 (two) times daily.           Multiple Vitamin (MULITIVITAMIN WITH MINERALS) TABS Take 1 tablet by mouth daily.           folic acid (FOLVITE) 800 MCG tablet Take 400 mcg by mouth 2 (two) times daily.           B Complex-C (B-COMPLEX WITH VITAMIN C) tablet Take 1 tablet by mouth daily.           fish oil-omega-3 fatty acids 1000 MG capsule Take 2 g by mouth 2 (two) times daily.           oxymetazoline (AFRIN) 0.05 % nasal spray Place 2 sprays into the nose at bedtime as needed. Nasal congestion           nitroGLYCERIN (NITROSTAT) 0.4 MG SL tablet Place 1 tablet (0.4 mg total) under the tongue every 5 (five) minutes x 3 doses as needed for chest pain.           traMADol (ULTRAM) 50 MG tablet Take 2 tablets (100 mg total) by mouth every 6 (six) hours as needed for pain. Normal dose is bedtime as needed for pain           oxycodone (OXY-IR) 5 MG capsule Take 1 capsule (5 mg total) by mouth every 6 (six) hours as needed.              Consults: None   Labs:  Results for orders placed during the hospital encounter of 12/07/11 (from the past 72 hour(s))  CBC     Status: Abnormal   Collection Time   12/07/11 11:58 PM      Component Value Range Comment   WBC 7.6  4.0 - 10.5 (K/uL)    RBC 3.01 (*) 4.22 - 5.81 (MIL/uL)    Hemoglobin 9.3 (*) 13.0 - 17.0 (g/dL)    HCT 95.6 (*) 21.3 - 52.0 (%)    MCV 92.0  78.0 - 100.0 (fL)    MCH 30.9   26.0 - 34.0 (pg)    MCHC 33.6  30.0 - 36.0 (g/dL)    RDW 08.6  57.8 - 46.9 (%)    Platelets 115 (*) 150 - 400 (K/uL) PLATELET COUNT CONFIRMED BY SMEAR  COMPREHENSIVE METABOLIC PANEL     Status: Abnormal   Collection Time   12/07/11 11:58 PM      Component Value Range Comment   Sodium 134 (*) 135 - 145 (mEq/L)    Potassium 4.5  3.5 - 5.1 (mEq/L)    Chloride 100  96 - 112 (mEq/L)    CO2 25  19 - 32 (mEq/L)    Glucose, Bld 142 (*) 70 - 99 (mg/dL)    BUN 24 (*) 6 - 23 (mg/dL)    Creatinine, Ser 6.29  0.50 - 1.35 (mg/dL)    Calcium 9.5  8.4 - 10.5 (mg/dL)  Total Protein 6.7  6.0 - 8.3 (g/dL)    Albumin 3.2 (*) 3.5 - 5.2 (g/dL)    AST 19  0 - 37 (U/L)    ALT 15  0 - 53 (U/L)    Alkaline Phosphatase 135 (*) 39 - 117 (U/L)    Total Bilirubin 1.2  0.3 - 1.2 (mg/dL)    GFR calc non Af Amer 85 (*) >90 (mL/min)    GFR calc Af Amer >90  >90 (mL/min)   D-DIMER, QUANTITATIVE     Status: Abnormal   Collection Time   12/07/11 11:58 PM      Component Value Range Comment   D-Dimer, Quant 1.96 (*) 0.00 - 0.48 (ug/mL-FEU)   POCT I-STAT TROPONIN I     Status: Normal   Collection Time   12/08/11 12:16 AM      Component Value Range Comment   Troponin i, poc 0.00  0.00 - 0.08 (ng/mL)    Comment 3            POCT I-STAT, CHEM 8     Status: Abnormal   Collection Time   12/08/11 12:19 AM      Component Value Range Comment   Sodium 138  135 - 145 (mEq/L)    Potassium 4.3  3.5 - 5.1 (mEq/L)    Chloride 103  96 - 112 (mEq/L)    BUN 23  6 - 23 (mg/dL)    Creatinine, Ser 4.09  0.50 - 1.35 (mg/dL)    Glucose, Bld 811 (*) 70 - 99 (mg/dL)    Calcium, Ion 9.14  1.12 - 1.32 (mmol/L)    TCO2 25  0 - 100 (mmol/L)    Hemoglobin 9.5 (*) 13.0 - 17.0 (g/dL)    HCT 78.2 (*) 95.6 - 52.0 (%)   CARDIAC PANEL(CRET KIN+CKTOT+MB+TROPI)     Status: Normal   Collection Time   12/08/11  8:20 AM      Component Value Range Comment   Total CK 55  7 - 232 (U/L)    CK, MB 2.1  0.3 - 4.0 (ng/mL)    Troponin I <0.30  <0.30  (ng/mL)    Relative Index RELATIVE INDEX IS INVALID  0.0 - 2.5    CBC     Status: Abnormal   Collection Time   12/08/11  8:20 AM      Component Value Range Comment   WBC 6.0  4.0 - 10.5 (K/uL)    RBC 2.93 (*) 4.22 - 5.81 (MIL/uL)    Hemoglobin 9.2 (*) 13.0 - 17.0 (g/dL)    HCT 21.3 (*) 08.6 - 52.0 (%)    MCV 93.2  78.0 - 100.0 (fL)    MCH 31.4  26.0 - 34.0 (pg)    MCHC 33.7  30.0 - 36.0 (g/dL)    RDW 57.8  46.9 - 62.9 (%)    Platelets 113 (*) 150 - 400 (K/uL) CONSISTENT WITH PREVIOUS RESULT  CREATININE, SERUM     Status: Abnormal   Collection Time   12/08/11  8:20 AM      Component Value Range Comment   Creatinine, Ser 0.84  0.50 - 1.35 (mg/dL)    GFR calc non Af Amer 87 (*) >90 (mL/min)    GFR calc Af Amer >90  >90 (mL/min)     Procedures/Imaging:  Ct Angio Chest W/cm &/or Wo Cm  12/08/2011  *RADIOLOGY REPORT*  Clinical Data: Chest pain and shoulder pain.  CT ANGIOGRAPHY CHEST  Technique:  Multidetector CT  imaging of the chest using the standard protocol during bolus administration of intravenous contrast. Multiplanar reconstructed images including MIPs were obtained and reviewed to evaluate the vascular anatomy.  Contrast: OMNIPAQUE IOHEXOL 350 MG/ML IV SOLN  Comparison: Chest radiograph performed earlier today at 12:02 a.m., and CT of the chest performed 03/28/2009  Findings: There is no evidence of pulmonary embolus.  Bilateral dependent subsegmental atelectasis is noted.  The lungs are otherwise grossly clear.  There is no evidence of significant focal consolidation, pleural effusion or pneumothorax.  No masses are identified; no abnormal focal contrast enhancement is seen.  Scattered coronary artery calcifications are seen.  The mediastinum is otherwise unremarkable in appearance.  No mediastinal lymphadenopathy is seen.  No pericardial effusion is identified. The great vessels are unremarkable in appearance.  No axillary lymphadenopathy is seen.  The thyroid gland is unremarkable  in appearance.  The visualized portions of the liver and spleen are unremarkable. The visualized portions of the pancreas, gallbladder, stomach, adrenal glands and kidneys are within normal limits.  No acute osseous abnormalities are seen.  Degenerative change is noted at C7-T1, with associated sclerosis and endplate irregularity.  Mild vacuum phenomenon is noted along the lower thoracic spine.  IMPRESSION:  1.  No evidence of pulmonary embolus. 2.  Bilateral dependent subsegmental atelectasis noted; lungs otherwise grossly clear. 3.  Scattered coronary artery calcifications seen. 4.  Degenerative change at the lower cervical spine.  Original Report Authenticated By: Tonia Ghent, M.D.   Dg Chest Portable 1 View  12/08/2011  *RADIOLOGY REPORT*  Clinical Data: Chest pain.  PORTABLE CHEST - 1 VIEW  Comparison: 10/09/2004 and CT from 03/28/2009  Findings: Single view of the chest was obtained. Slightly decreased lung volumes compared the prior exam.  No gross abnormality to the heart or mediastinum.  Bony structures are intact.  Degenerative changes in the right shoulder.  IMPRESSION: No acute chest findings.  Original Report Authenticated By: Richarda Overlie, M.D.     Brief Hospital Course: 71 yo male with hx aplastic anemia and PNH who presents with right sided MSK pain and chest pain secondary to moving heavy furniture. 1. Chest pain: this is likely MSK in etiology but pt high risk. EKG negative. CTA negative for PE. CE negative. CP has resolved.  Pain was controlled with Morphine IV PRN, then transition to PO oxycodone and Tramadol 100 q 6 PRN pain . Outpatient stress test with [facility] (Friday 12/10/11 @ 10:15 AM) .Pt was d.c home  with NTG PRN chest pain   2. Shoulder, neck, rib pain: secondary to lifting heavy furniture.  Pain control with Oxycodone, Tramadol PRN   3. PNH/Aplastic anemia: was given first dose of Cyclosporine in house Patient has follow up appointment today with Heme/Onc   4. HLD:  continue vitamins. Defer statin to PCP.  Patient condition at time of discharge/disposition:  Patient is discharge home on stable medical condition.    Discharge follow up:  Pt should make an appointment for f/u 7-10 days of admission at [facility]. His PCP  D. Piloto Rolene Arbour, MD  [facility] 12/08/2011

## 2011-12-08 NOTE — H&P (Signed)
Vincent Jensen is an 71 y.o. male.    Chief Complaint: chest pain  HPI:   Patient is a 71 year old male with hx of PNH and aplastic anemia who presents with R sided chest pain, R rib cage pain, R shoulder pain that started on Sunday.  Symptoms started one day after moving a heavy rug.  He took Tramadol for pain with little relief, so came to ED on Tuesday evening for persistent pain.  Pain is located R rib cage posterior to R axillary, right shoulder, and posterior cervical spine.  At one point, pain radiated to L sternal border but this pain has resolved.  Patient denies any associated SOB, nausea/vomiting, numbness/tingling of upper extremities.  He denies any abdominal pain, GI issues, decreased appetite.  No dysuria.  Currently in ED, chest pain has resolved and right shoulder pain is slowly improving after receiving multiple doses of Fentanyl.  ED course: EKG normal sinus rhythm.  Fentanyl IV for pain.  CTA did not show D-dimer but did show coronary calcifications.  We were called to admit for ACS R/O.  Past Medical History  Diagnosis Date  . Aplastic anemia   . PNH (paroxysmal nocturnal hemoglobinuria)   . Diverticulosis   . Arthritis     Past Surgical History  Procedure Date  . Joint replacement   . Appendectomy     Family History  Problem Relation Age of Onset  . Myasthenia gravis Mother   . Cancer Father    Social History:  reports that he has never smoked. He does not have any smokeless tobacco history on file. He reports that he does not drink alcohol or use illicit drugs.  Allergies: No Known Allergies  Medications Prior to Admission  Medication Dose Route Frequency Provider Last Rate Last Dose  . 0.9 %  sodium chloride infusion  250 mL Intravenous PRN Braydan Marriott Tye Savoy, MD      . acetaminophen (TYLENOL) tablet 650 mg  650 mg Oral Q4H PRN Lamarius Dirr Tye Savoy, MD      . aspirin chewable tablet 324 mg  324 mg Oral Once Sunnie Nielsen, MD   324 mg at 12/08/11 0534  . aspirin  chewable tablet 324 mg  324 mg Oral NOW Jadeyn Hargett de Lawson Radar, MD       Or  . aspirin suppository 300 mg  300 mg Rectal NOW Roneka Gilpin Tye Savoy, MD      . aspirin EC tablet 81 mg  81 mg Oral Daily Carline Dura Tye Savoy, MD      . B-complex with vitamin C tablet 1 tablet  1 tablet Oral Daily Klayten Jolliff Tye Savoy, MD      . cycloSPORINE (SANDIMMUNE) capsule 200 mg  200 mg Oral BID Meshawn Oconnor Tye Savoy, MD      . diazepam (VALIUM) tablet 5 mg  5 mg Oral Once Sunnie Nielsen, MD   5 mg at 12/08/11 0428  . enoxaparin (LOVENOX) injection 40 mg  40 mg Subcutaneous Daily Albertine Lafoy de Lawson Radar, MD      . fentaNYL (SUBLIMAZE) injection 50 mcg  50 mcg Intravenous Once Sunnie Nielsen, MD   50 mcg at 12/08/11 0038  . fentaNYL (SUBLIMAZE) injection 50 mcg  50 mcg Intravenous Once Sunnie Nielsen, MD   50 mcg at 12/08/11 0202  . fentaNYL (SUBLIMAZE) injection 50 mcg  50 mcg Intravenous Once Sunnie Nielsen, MD   50 mcg at 12/08/11 0424  . folic acid (FOLVITE) tablet 0.5  mg  0.5 mg Oral BID Leighton Roach McDiarmid, MD      . HYDROmorphone (DILAUDID) injection 1 mg  1 mg Intravenous Once Sunnie Nielsen, MD   1 mg at 12/08/11 0533  . iohexol (OMNIPAQUE) 350 MG/ML injection 100 mL  100 mL Intravenous Once PRN Medication Radiologist, MD   100 mL at 12/08/11 0250  . morphine 4 MG/ML injection 4 mg  4 mg Intravenous Q3H PRN Jaslene Marsteller Tye Savoy, MD      . mulitivitamin with minerals tablet 1 tablet  1 tablet Oral Daily Marquese Burkland Tye Savoy, MD      . nitroGLYCERIN (NITROSTAT) SL tablet 0.4 mg  0.4 mg Sublingual Q5 Min x 3 PRN Rynlee Lisbon Tye Savoy, MD      . ondansetron [facility]) injection 4 mg  4 mg Intravenous Once Sunnie Nielsen, MD   4 mg at 12/08/11 0037  . ondansetron (ZOFRAN) injection 4 mg  4 mg Intravenous Q6H PRN Shelbia Scinto Tye Savoy, MD      . oxymetazoline (AFRIN) 0.05 % nasal spray 2 spray  2 spray Each Nare BID PRN Caileb Rhue Tye Savoy, MD      . sodium chloride 0.9 % injection 3 mL  3 mL Intravenous Q12H Hina Gupta de Lawson Radar, MD      . sodium chloride 0.9 % injection 3 mL  3 mL Intravenous PRN Tahtiana Rozier Tye Savoy, MD      . DISCONTD: folic acid (FOLVITE) tablet 400 mcg  400 mcg Oral BID Ailynn Gow Tye Savoy, MD      . DISCONTD: ibuprofen (ADVIL,MOTRIN) 200 MG tablet           . DISCONTD: ibuprofen (ADVIL,MOTRIN) tablet 600 mg  600 mg Oral Once Sunnie Nielsen, MD       Medications Prior to Admission  Medication Sig Dispense Refill  . B Complex-C (B-COMPLEX WITH VITAMIN C) tablet Take 1 tablet by mouth daily.      . cycloSPORINE (SANDIMMUNE) 100 MG capsule Take 200 mg by mouth 2 (two) times daily.      . fish oil-omega-3 fatty acids 1000 MG capsule Take 2 g by mouth 2 (two) times daily.      . folic acid (FOLVITE) 800 MCG tablet Take 400 mcg by mouth 2 (two) times daily.      . Multiple Vitamin (MULITIVITAMIN WITH MINERALS) TABS Take 1 tablet by mouth daily.      Marland Kitchen oxymetazoline (AFRIN) 0.05 % nasal spray Place 2 sprays into the nose at bedtime as needed. Nasal congestion      . traMADol (ULTRAM) 50 MG tablet Take 50 mg by mouth every 8 (eight) hours as needed. Normal dose is bedtime as needed for pain        Results for orders placed during the hospital encounter of 12/07/11 (from the past 48 hour(s))  CBC     Status: Abnormal   Collection Time   12/07/11 11:58 PM      Component Value Range Comment   WBC 7.6  4.0 - 10.5 (K/uL)    RBC 3.01 (*) 4.22 - 5.81 (MIL/uL)    Hemoglobin 9.3 (*) 13.0 - 17.0 (g/dL)    HCT 16.1 (*) 09.6 - 52.0 (%)    MCV 92.0  78.0 - 100.0 (fL)    MCH 30.9  26.0 - 34.0 (pg)    MCHC 33.6  30.0 - 36.0 (g/dL)    RDW 04.5  40.9 - 81.1 (%)  Platelets 115 (*) 150 - 400 (K/uL) PLATELET COUNT CONFIRMED BY SMEAR  COMPREHENSIVE METABOLIC PANEL     Status: Abnormal   Collection Time   12/07/11 11:58 PM      Component Value Range Comment   Sodium 134 (*) 135 - 145 (mEq/L)    Potassium 4.5  3.5 - 5.1 (mEq/L)    Chloride 100  96 - 112 (mEq/L)    CO2 25  19 - 32 (mEq/L)    Glucose, Bld 142 (*) 70 - 99 (mg/dL)    BUN 24 (*) 6 - 23 (mg/dL)    Creatinine, Ser 1.61  0.50 - 1.35 (mg/dL)      Calcium 9.5  8.4 - 10.5 (mg/dL)    Total Protein 6.7  6.0 - 8.3 (g/dL)    Albumin 3.2 (*) 3.5 - 5.2 (g/dL)    AST 19  0 - 37 (U/L)    ALT 15  0 - 53 (U/L)    Alkaline Phosphatase 135 (*) 39 - 117 (U/L)    Total Bilirubin 1.2  0.3 - 1.2 (mg/dL)    GFR calc non Af Amer 85 (*) >90 (mL/min)    GFR calc Af Amer >90  >90 (mL/min)   D-DIMER, QUANTITATIVE     Status: Abnormal   Collection Time   12/07/11 11:58 PM      Component Value Range Comment   D-Dimer, Quant 1.96 (*) 0.00 - 0.48 (ug/mL-FEU)   POCT I-STAT TROPONIN I     Status: Normal   Collection Time   12/08/11 12:16 AM      Component Value Range Comment   Troponin i, poc 0.00  0.00 - 0.08 (ng/mL)    Comment 3            POCT I-STAT, CHEM 8     Status: Abnormal   Collection Time   12/08/11 12:19 AM      Component Value Range Comment   Sodium 138  135 - 145 (mEq/L)    Potassium 4.3  3.5 - 5.1 (mEq/L)    Chloride 103  96 - 112 (mEq/L)    BUN 23  6 - 23 (mg/dL)    Creatinine, Ser 0.96  0.50 - 1.35 (mg/dL)    Glucose, Bld 045 (*) 70 - 99 (mg/dL)    Calcium, Ion 4.09  1.12 - 1.32 (mmol/L)    TCO2 25  0 - 100 (mmol/L)    Hemoglobin 9.5 (*) 13.0 - 17.0 (g/dL)    HCT 81.1 (*) 91.4 - 52.0 (%)    Ct Angio Chest W/cm &/or Wo Cm  12/08/2011  *RADIOLOGY REPORT*  Clinical Data: Chest pain and shoulder pain.  CT ANGIOGRAPHY CHEST  Technique:  Multidetector CT imaging of the chest using the standard protocol during bolus administration of intravenous contrast. Multiplanar reconstructed images including MIPs were obtained and reviewed to evaluate the vascular anatomy.  Contrast: OMNIPAQUE IOHEXOL 350 MG/ML IV SOLN  Comparison: Chest radiograph performed earlier today at 12:02 a.m., and CT of the chest performed 03/28/2009  Findings: There is no evidence of pulmonary embolus.  Bilateral dependent subsegmental atelectasis is noted.  The lungs are otherwise grossly clear.  There is no evidence of significant focal consolidation, pleural effusion  or pneumothorax.  No masses are identified; no abnormal focal contrast enhancement is seen.  Scattered coronary artery calcifications are seen.  The mediastinum is otherwise unremarkable in appearance.  No mediastinal lymphadenopathy is seen.  No pericardial effusion is identified. The great vessels are  unremarkable in appearance.  No axillary lymphadenopathy is seen.  The thyroid gland is unremarkable in appearance.  The visualized portions of the liver and spleen are unremarkable. The visualized portions of the pancreas, gallbladder, stomach, adrenal glands and kidneys are within normal limits.  No acute osseous abnormalities are seen.  Degenerative change is noted at C7-T1, with associated sclerosis and endplate irregularity.  Mild vacuum phenomenon is noted along the lower thoracic spine.  IMPRESSION:  1.  No evidence of pulmonary embolus. 2.  Bilateral dependent subsegmental atelectasis noted; lungs otherwise grossly clear. 3.  Scattered coronary artery calcifications seen. 4.  Degenerative change at the lower cervical spine.  Original Report Authenticated By: Tonia Ghent, M.D.   Dg Chest Portable 1 View  12/08/2011  *RADIOLOGY REPORT*  Clinical Data: Chest pain.  PORTABLE CHEST - 1 VIEW  Comparison: 10/09/2004 and CT from 03/28/2009  Findings: Single view of the chest was obtained. Slightly decreased lung volumes compared the prior exam.  No gross abnormality to the heart or mediastinum.  Bony structures are intact.  Degenerative changes in the right shoulder.  IMPRESSION: No acute chest findings.  Original Report Authenticated By: Richarda Overlie, M.D.    ROS  Per HPI  Blood pressure 124/80, pulse 78, temperature 98.1 F (36.7 C), resp. rate 18, SpO2 98.00%.  Physical Exam   Assessment/Plan 71 yo male with hx aplastic anemia and PNH who presents with right sided MSK pain and chest pain: 1. Chest pain: this is likely MSK in etiology.  EKG negative.  CTA negative for PE.  First set of CE negative.   CP has resolved. - Will order final set of CE - Pain control with Morphine IV PRN, then transition to PO oxycodone and Tramadol 100 q 6 PRN pain - Will schedule outpatient stress test with [facility] (Friday 12/10/11 @ 10:15 AM) - Send home with NTG PRN chest pain  2. Shoulder, neck, rib pain: secondary to lifting heavy furniture. - Pain control with Oxycodone, Tramadol PRN  3. PNH/Aplastic anemia: - Will give first dose of Cyclosporine in house - Patient has follow up appointment today with Heme/Onc  4. HLD: continue vitamins. Defer statin to PCP. 5. FEN/GI: HH diet 6. PPX: Lovenox 40 daily 7. Dispo: plan to discharge patient this morning pending last set of CE, we hope he can make it to his Oncology appointment today.  Stress test has been scheduled for Friday @ 10:15 AM.  DE LA CRUZ,Lolah Coghlan 12/08/2011, 8:41 AM

## 2011-12-08 NOTE — ED Notes (Signed)
Advised the MD the patient was throwing bigeminal PVCs.

## 2011-12-08 NOTE — Discharge Summary (Signed)
I have seen and examined this patient. I have discussed with Dr Aviva Signs.  I agree with their findings and plans as documented in their discharge note for today.

## 2011-12-08 NOTE — ED Provider Notes (Signed)
History     CSN: [id number]  Arrival date & time 12/07/11  2134   First MD Initiated Contact with Patient 12/07/11 2346      Chief Complaint  Patient presents with  . Chest Pain     R chest  . Shoulder Pain    R shoulder  . Neck Pain    (Consider location/radiation/quality/duration/timing/severity/associated sxs/prior treatment) HPI Right sided chest neck and back pain. Symptoms started after moving a heavy carpet a few days ago. Describes as sharp and feels like muscle pain. Hurts to move his right arm and causes a catching pain that does affect his breathing. Patient spoke with his hematologist today who recommended he come to the hospital to be evaluated. He was prescribed Ultram for this and states that medicine is not helping. No other trauma or injury. No fevers or chills. Some shortness of breath. No known cardiac history. No cough or congestion. No fevers or chills. No weakness or numbness in his right arm. No midline neck pain. Pain is severe.  Past Medical History  Diagnosis Date  . Aplastic anemia   . PNH (paroxysmal nocturnal hemoglobinuria)   . Diverticulosis   . Arthritis     Past Surgical History  Procedure Date  . Joint replacement   . Appendectomy     Family History  Problem Relation Age of Onset  . Myasthenia gravis Mother   . Cancer Father     History  Substance Use Topics  . Smoking status: Never Smoker   . Smokeless tobacco: Not on file  . Alcohol Use: No      Review of Systems  Constitutional: Negative for fever and chills.  HENT: Negative for neck pain and neck stiffness.   Eyes: Negative for pain.  Respiratory: Negative for shortness of breath.   Cardiovascular: Positive for chest pain.  Gastrointestinal: Negative for abdominal pain.  Genitourinary: Negative for dysuria.  Musculoskeletal: Positive for back pain.  Skin: Negative for rash.  Neurological: Negative for headaches.  All other systems reviewed and are  negative.    Allergies  Review of patient's allergies indicates no known allergies.  Home Medications   Current Outpatient Rx  Name Route Sig Dispense Refill  . B COMPLEX-C PO TABS Oral Take 1 tablet by mouth daily.    . CYCLOSPORINE 100 MG PO CAPS Oral Take 200 mg by mouth 2 (two) times daily.    . OMEGA-3 FATTY ACIDS 1000 MG PO CAPS Oral Take 2 g by mouth 2 (two) times daily.    Marland Kitchen FOLIC ACID 800 MCG PO TABS Oral Take 400 mcg by mouth 2 (two) times daily.    . ADULT MULTIVITAMIN W/MINERALS CH Oral Take 1 tablet by mouth daily.    Marland Kitchen OXYMETAZOLINE HCL 0.05 % NA SOLN Nasal Place 2 sprays into the nose at bedtime as needed. Nasal congestion    . TRAMADOL HCL 50 MG PO TABS Oral Take 50 mg by mouth every 8 (eight) hours as needed. Normal dose is bedtime as needed for pain      BP 136/77  Pulse 84  Resp 24  SpO2 97%  Physical Exam  Constitutional: He is oriented to person, place, and time. He appears well-developed and well-nourished.  HENT:  Head: Normocephalic and atraumatic.  Eyes: Conjunctivae and EOM are normal. Pupils are equal, round, and reactive to light.  Neck: Trachea normal. Neck supple. No thyromegaly present.  Cardiovascular: Normal rate, regular rhythm, S1 normal, S2 normal and normal pulses.  No systolic murmur is present   No diastolic murmur is present  Pulses:      Radial pulses are 2+ on the right side, and 2+ on the left side.  Pulmonary/Chest: Effort normal and breath sounds normal. He has no wheezes. He has no rhonchi. He has no rales.       Localizes discomfort to right upper chest and right trapezial area. No crepitus and no rash.  Abdominal: Soft. Normal appearance and bowel sounds are normal. There is no tenderness. There is no CVA tenderness and negative Murphy's sign.  Musculoskeletal:       BLE:s Calves nontender, no cords or erythema, negative Homans sign  Neurological: He is alert and oriented to person, place, and time. He has normal strength.  No cranial nerve deficit or sensory deficit. GCS eye subscore is 4. GCS verbal subscore is 5. GCS motor subscore is 6.  Skin: Skin is warm and dry. No rash noted. He is not diaphoretic.  Psychiatric: His speech is normal.       Cooperative and appropriate    ED Course  Procedures (including critical care time)  Labs Reviewed  CBC - Abnormal; Notable for the following:    RBC 3.01 (*)    Hemoglobin 9.3 (*)    HCT 27.7 (*)    Platelets 115 (*) PLATELET COUNT CONFIRMED BY SMEAR   All other components within normal limits  COMPREHENSIVE METABOLIC PANEL - Abnormal; Notable for the following:    Sodium 134 (*)    Glucose, Bld 142 (*)    BUN 24 (*)    Albumin 3.2 (*)    Alkaline Phosphatase 135 (*)    GFR calc non Af Amer 85 (*)    All other components within normal limits  D-DIMER, QUANTITATIVE - Abnormal; Notable for the following:    D-Dimer, Quant 1.96 (*)    All other components within normal limits  POCT I-STAT, CHEM 8 - Abnormal; Notable for the following:    Glucose, Bld 144 (*)    Hemoglobin 9.5 (*)    HCT 28.0 (*)    All other components within normal limits  POCT I-STAT TROPONIN I   Ct Angio Chest W/cm &/or Wo Cm  12/08/2011  *RADIOLOGY REPORT*  Clinical Data: Chest pain and shoulder pain.  CT ANGIOGRAPHY CHEST  Technique:  Multidetector CT imaging of the chest using the standard protocol during bolus administration of intravenous contrast. Multiplanar reconstructed images including MIPs were obtained and reviewed to evaluate the vascular anatomy.  Contrast: OMNIPAQUE IOHEXOL 350 MG/ML IV SOLN  Comparison: Chest radiograph performed earlier today at 12:02 a.m., and CT of the chest performed 03/28/2009  Findings: There is no evidence of pulmonary embolus.  Bilateral dependent subsegmental atelectasis is noted.  The lungs are otherwise grossly clear.  There is no evidence of significant focal consolidation, pleural effusion or pneumothorax.  No masses are identified; no  abnormal focal contrast enhancement is seen.  Scattered coronary artery calcifications are seen.  The mediastinum is otherwise unremarkable in appearance.  No mediastinal lymphadenopathy is seen.  No pericardial effusion is identified. The great vessels are unremarkable in appearance.  No axillary lymphadenopathy is seen.  The thyroid gland is unremarkable in appearance.  The visualized portions of the liver and spleen are unremarkable. The visualized portions of the pancreas, gallbladder, stomach, adrenal glands and kidneys are within normal limits.  No acute osseous abnormalities are seen.  Degenerative change is noted at C7-T1, with associated sclerosis and endplate  irregularity.  Mild vacuum phenomenon is noted along the lower thoracic spine.  IMPRESSION:  1.  No evidence of pulmonary embolus. 2.  Bilateral dependent subsegmental atelectasis noted; lungs otherwise grossly clear. 3.  Scattered coronary artery calcifications seen. 4.  Degenerative change at the lower cervical spine.  Original Report Authenticated By: Tonia Ghent, M.D.   Dg Chest Portable 1 View  12/08/2011  *RADIOLOGY REPORT*  Clinical Data: Chest pain.  PORTABLE CHEST - 1 VIEW  Comparison: 10/09/2004 and CT from 03/28/2009  Findings: Single view of the chest was obtained. Slightly decreased lung volumes compared the prior exam.  No gross abnormality to the heart or mediastinum.  Bony structures are intact.  Degenerative changes in the right shoulder.  IMPRESSION: No acute chest findings.  Original Report Authenticated By: Richarda Overlie, M.D.    Date: 12/08/2011  Rate: 79  Rhythm: normal sinus rhythm  QRS Axis: left  Intervals: normal  ST/T Wave abnormalities: nonspecific ST changes  Conduction Disutrbances:none  Narrative Interpretation: [facility]  Old EKG Reviewed: none available  IV fentanyl. Multiple boluses with persistent severe pain. Elevated d-dimer and CT scan obtained and reviewed as above. Does have coronary artery calcifications  and in the ED developed a change in right chest pain. Medicine consult for admission. Case discussed as above with family practice resident on call who agrees to admission  MDM   Right-sided chest pain with CAT scan demonstrating coronary artery calcifications. No EKG changes. Labs and x-ray obtained and reviewed as above.        Sunnie Nielsen, MD 12/08/11 [phone]

## 2011-12-08 NOTE — Discharge Instructions (Signed)
Please report to [facility] on Friday 12/10/11 @ 10:15 AM. Do not drink anything 12 hours prior to Stress Myoview. Bring medication list to this appointment. Do not wear cologne or after-shave (someone in the office allergic).

## 2011-12-08 NOTE — H&P (Signed)
I have seen and examined this patient. I have discussed with Dr Tye Savoy.  I agree with their findings and plans as documented in their progress note for today.  Acute Issue Chest pain, atypical - Chest pain was located right anterolateral chest. Tender to palpation. - Associated with bilateral thoracic back, cervical pain after physical straining from moving rug. - PMH significant for Nuclear stress test at [facility] that pt reports as not showing evidence of CAD. - Unremarkable EKG, Chest CT-Angiogram except for scattered coronary artery calcifications. - No further chest pain.  - Tender to palpation right anterolateral chest wall  - Patient has appointment with his Hematologist for Aplastic Anemia and Paroxysmal Nocturnal Hematuria today at [facility] at 1:30 pm.  - Pt ruled out for acute MI.  No further chest pain.  Recommend outpatient stress testing with [facility].  Patient reports not being able to take aspirin.  Sent home with NTG SL to use PRN.

## 2011-12-09 NOTE — Telephone Encounter (Signed)
FYI, Dr. Georgiana Shore I believe this was a patient you saw the other night.

## 2011-12-09 NOTE — Telephone Encounter (Signed)
Addended by: Tye Savoy, Chavela Justiniano on: 12/09/2011 03:40 PM   Modules accepted: Orders

## 2011-12-14 DIAGNOSIS — D596 Hemoglobinuria due to hemolysis from other external causes: Secondary | ICD-10-CM | POA: Diagnosis not present

## 2011-12-20 DIAGNOSIS — D596 Hemoglobinuria due to hemolysis from other external causes: Secondary | ICD-10-CM | POA: Diagnosis not present

## 2011-12-27 DIAGNOSIS — D596 Hemoglobinuria due to hemolysis from other external causes: Secondary | ICD-10-CM | POA: Diagnosis not present

## 2012-01-03 DIAGNOSIS — D596 Hemoglobinuria due to hemolysis from other external causes: Secondary | ICD-10-CM | POA: Diagnosis not present

## 2012-01-11 DIAGNOSIS — D596 Hemoglobinuria due to hemolysis from other external causes: Secondary | ICD-10-CM | POA: Diagnosis not present

## 2012-01-13 DIAGNOSIS — R0602 Shortness of breath: Secondary | ICD-10-CM | POA: Diagnosis not present

## 2012-01-13 DIAGNOSIS — R079 Chest pain, unspecified: Secondary | ICD-10-CM | POA: Diagnosis not present

## 2012-01-18 DIAGNOSIS — D596 Hemoglobinuria due to hemolysis from other external causes: Secondary | ICD-10-CM | POA: Diagnosis not present

## 2012-01-20 DIAGNOSIS — D596 Hemoglobinuria due to hemolysis from other external causes: Secondary | ICD-10-CM | POA: Diagnosis not present

## 2012-01-25 DIAGNOSIS — D596 Hemoglobinuria due to hemolysis from other external causes: Secondary | ICD-10-CM | POA: Diagnosis not present

## 2012-01-31 DIAGNOSIS — D596 Hemoglobinuria due to hemolysis from other external causes: Secondary | ICD-10-CM | POA: Diagnosis not present

## 2012-02-01 ENCOUNTER — Encounter: Payer: Medicare Other | Admitting: Physician Assistant

## 2012-02-01 DIAGNOSIS — M79609 Pain in unspecified limb: Secondary | ICD-10-CM | POA: Diagnosis not present

## 2012-02-01 DIAGNOSIS — D596 Hemoglobinuria due to hemolysis from other external causes: Secondary | ICD-10-CM | POA: Diagnosis not present

## 2012-02-01 NOTE — Progress Notes (Signed)
  Subjective:    Patient ID: Vincent Jensen, male    DOB: 09/29/1941, 71 y.o.   MRN: [id number]  HPI Error in opening the encounter.   Review of Systems     Objective:   Physical Exam        Assessment & Plan:   This encounter was created in error - please disregard.

## 2012-02-03 ENCOUNTER — Ambulatory Visit: Payer: Medicare Other

## 2012-02-03 ENCOUNTER — Ambulatory Visit: Payer: Medicare Other | Admitting: Family Medicine

## 2012-02-03 VITALS — BP 129/73 | HR 95 | Temp 99.3°F | Resp 16 | Ht 67.5 in | Wt 161.4 lb

## 2012-02-03 DIAGNOSIS — R5383 Other fatigue: Secondary | ICD-10-CM

## 2012-02-03 DIAGNOSIS — M25529 Pain in unspecified elbow: Secondary | ICD-10-CM

## 2012-02-03 DIAGNOSIS — M542 Cervicalgia: Secondary | ICD-10-CM

## 2012-02-03 DIAGNOSIS — D619 Aplastic anemia, unspecified: Secondary | ICD-10-CM

## 2012-02-03 DIAGNOSIS — M130 Polyarthritis, unspecified: Secondary | ICD-10-CM

## 2012-02-03 DIAGNOSIS — R200 Anesthesia of skin: Secondary | ICD-10-CM

## 2012-02-03 LAB — RHEUMATOID FACTOR: Rheumatoid fact SerPl-aCnc: 11 [IU]/mL (ref <=14)

## 2012-02-03 LAB — C-REACTIVE PROTEIN: CRP: 31.17 mg/dL — ABNORMAL HIGH (ref <0.60)

## 2012-02-03 LAB — POCT SEDIMENTATION RATE: POCT SED RATE: 131 mm/hr — AB (ref 0–22)

## 2012-02-03 LAB — URIC ACID: Uric Acid, Serum: 7.5 mg/dL (ref 4.0–7.8)

## 2012-02-03 NOTE — Progress Notes (Signed)
[facility]:  Office Visit  Chief Complaint:  Chief Complaint  Patient presents with  . Shoulder Pain    both shoulders painful x 2 years also constipated since friday    HPI: Vincent Jensen is a 71 y.o. male who complains of  MMP: 1. Fatigue and SOB-ongoing issue 2. Joint pain-started a while back but now has worsened in last 2-3 weeks. He has neck and elbow pain more on left than right with cracking and swelling. It is sharp, 10./10 and increases with ROM; this is associated with groinpain, specifically when he tries to get out of car. He has had hip and knee replacements.   He has PNH and sess an oncologist. Recent labs done were WNL for him,. Additionally he has had a chest CT which did not show any fluid, PE, or pneumothorax; he had a negative dopller of Vincent Jensen. Patient was asked by oncologist to get further work-up for polyarthritis? By PCP.    Past Medical History  Diagnosis Date  . Aplastic anemia   . PNH (paroxysmal nocturnal hemoglobinuria)   . Diverticulosis   . Arthritis    Past Surgical History  Procedure Date  . Joint replacement   . Appendectomy    History   Social History  . Marital Status: Single    Spouse Name: N/A    Number of Children: N/A  . Years of Education: N/A   Social History Main Topics  . Smoking status: Never Smoker   . Smokeless tobacco: Not on file  . Alcohol Use: No  . Drug Use: No  . Sexually Active:    Other Topics Concern  . Not on file   Social History Narrative  . No narrative on file   Family History  Problem Relation Age of Onset  . Myasthenia gravis Mother   . Cancer Father    No Known Allergies Prior to Admission medications   Medication Sig Start Date End Date Taking? Authorizing Provider  B Complex-C (B-COMPLEX WITH VITAMIN C) tablet Take 1 tablet by mouth daily.   Yes Historical Provider, MD  cycloSPORINE (SANDIMMUNE) 100 MG capsule Take 200 mg by mouth 2 (two) times daily.   Yes Historical  Provider, MD  fish oil-omega-3 fatty acids 1000 MG capsule Take 2 g by mouth 2 (two) times daily.   Yes Historical Provider, MD  folic acid (FOLVITE) 800 MCG tablet Take 400 mcg by mouth 2 (two) times daily.   Yes Historical Provider, MD  Multiple Vitamin (MULITIVITAMIN WITH MINERALS) TABS Take 1 tablet by mouth daily.   Yes Historical Provider, MD  nitroGLYCERIN (NITROSTAT) 0.4 MG SL tablet Place 1 tablet (0.4 mg total) under the tongue every 5 (five) minutes x 3 doses as needed for chest pain. 12/08/11 12/07/12 Yes Ivy de Lawson Radar, MD  oxymetazoline (AFRIN) 0.05 % nasal spray Place 2 sprays into the nose at bedtime as needed. Nasal congestion   Yes Historical Provider, MD  traMADol (ULTRAM) 50 MG tablet Take 2 tablets (100 mg total) by mouth every 6 (six) hours as needed for pain. Normal dose is bedtime as needed for pain 12/08/11  Yes Ivy de Lawson Radar, MD     ROS: The patient denies fevers, chills, night sweats, unintentional weight loss, chest pain, palpitations, wheezing, dyspnea on exertion, nausea, vomiting, abdominal pain, dysuria, hematuria, melena, numbness, weakness, or tingling.   All other systems have been reviewed and were otherwise negative with the exception of those mentioned in the HPI and  as above.    PHYSICAL EXAM: Filed Vitals:   02/03/12 1151  BP: 129/73  Pulse: 95  Temp: 99.3 F (37.4 C)  Resp: 16   Filed Vitals:   02/03/12 1151  Height: 5' 7.5" (1.715 m)  Weight: 161 lb 6.4 oz (73.211 kg)   Body mass index is 24.91 kg/(m^2).  General: Alert, no acute distress, anxious HEENT:  Normocephalic, atraumatic, oropharynx patent.  Cardiovascular:  Regular rate and rhythm, no rubs murmurs or gallops.  No Carotid bruits, radial pulse intact. No pedal edema.  Respiratory: Clear to auscultation bilaterally.  No wheezes, rales, or rhonchi.  No cyanosis, no use of accessory musculature GI: No organomegaly, abdomen is soft and non-tender, positive bowel sounds.  No masses. Skin:  No rashes. Neurologic: Facial musculature symmetric. Psychiatric: Patient is appropriate throughout our interaction. Lymphatic: No cervical lymphadenopathy Musculoskeletal: Gait intact.  + swelling in left elbow, + decrease ROM inleft elbow in abbduction and adduction, + crepitus, 5/5 strength, senstation intact radial pulse intact. + decrease ROM in c-spine, unable to fully do AROM/PROm in lateral SB and rotation and extension of neck, tender along paraspinal msk, neg spurling.   LABS: Results for orders placed in visit on 02/03/12  POCT SEDIMENTATION RATE      Component Value Range   POCT SED RATE 131 (*) 0 - 22 (mm/hr)     EKG/XRAY:   Primary read interpreted by Dr. Conley Rolls at [facility]. C-spine- C3-5 severe DJD, no subluxation/fx Left elbow-+ osteophyte, ? Cystic changes , no fx/dislocation   ASSESSMENT/PLAN: Encounter Diagnoses  Name Primary?  . Neck pain Yes  . Elbow pain   . Polyarthritis   . Fatigue   . Aplastic anemia   . Numbness and tingling    1, Fatigue-secondary to his PNH and aplastic anemia Hgb on 4/16 from oncology was 10.6 ( baseline is 9.5-10)  Vs cyclosporine? Will check TSH, Vit B12, folate 2. Multiple joint pain-Etiology: infectious arthritis vs RA vs OA/DJD vs medication SE ( he is on cyclosporine x 4 months) vs autoimmune vs gonococcal vs less likely gout. Labs pending: RF, CRP, ANA, uriprobe G/C, uric acid 3. Xrays as above. 4. Advise to c/w Vicodin that was rx to him by oncology for pain control. He cannot take NSAIDs due to PNH and anemia and potential for bleeds   Vincent Okazaki PHUONG, DO 02/03/2012 5:15 PM

## 2012-02-04 LAB — VITAMIN B12: Vitamin B-12: 1197 pg/mL — ABNORMAL HIGH (ref 211–911)

## 2012-02-04 LAB — FOLATE: Folate: >20 ng/mL

## 2012-02-04 LAB — TSH: TSH: 0.919 u[IU]/mL (ref 0.350–4.500)

## 2012-02-04 LAB — GC/CHLAMYDIA PROBE AMP, URINE
Chlamydia, Swab/Urine, PCR: NEGATIVE
GC Probe Amp, Urine: NEGATIVE

## 2012-02-07 ENCOUNTER — Telehealth: Payer: Self-pay | Admitting: Family Medicine

## 2012-02-07 LAB — ANTI-NUCLEAR AB-TITER (ANA TITER): ANA Titer 1: NEGATIVE

## 2012-02-07 LAB — ANA: Anti Nuclear Antibody(ANA): POSITIVE — AB

## 2012-02-07 NOTE — Telephone Encounter (Signed)
Called patient in regards to lab results. Would like him to call me back on personal cell phone. Will refer him to rheumatologist but wanted to discuss labs further

## 2012-02-07 NOTE — Telephone Encounter (Signed)
Spoke with patient regarding labs.

## 2012-02-09 ENCOUNTER — Encounter: Payer: Self-pay | Admitting: Family Medicine

## 2012-02-09 DIAGNOSIS — R5381 Other malaise: Secondary | ICD-10-CM | POA: Diagnosis not present

## 2012-02-09 DIAGNOSIS — D61818 Other pancytopenia: Secondary | ICD-10-CM | POA: Diagnosis not present

## 2012-02-09 DIAGNOSIS — M19019 Primary osteoarthritis, unspecified shoulder: Secondary | ICD-10-CM | POA: Diagnosis not present

## 2012-02-09 DIAGNOSIS — D596 Hemoglobinuria due to hemolysis from other external causes: Secondary | ICD-10-CM | POA: Diagnosis not present

## 2012-02-09 DIAGNOSIS — M1710 Unilateral primary osteoarthritis, unspecified knee: Secondary | ICD-10-CM | POA: Diagnosis not present

## 2012-02-10 ENCOUNTER — Other Ambulatory Visit: Payer: Self-pay | Admitting: Family Medicine

## 2012-02-10 DIAGNOSIS — M2550 Pain in unspecified joint: Secondary | ICD-10-CM

## 2012-02-10 DIAGNOSIS — R7982 Elevated C-reactive protein (CRP): Secondary | ICD-10-CM

## 2012-02-16 ENCOUNTER — Telehealth: Payer: Self-pay

## 2012-02-16 NOTE — Telephone Encounter (Signed)
OMEGA CALLED FROM DR. BEEKMAN'S OFFICE WANTING ONCOLOGY RECORDS FOR PT.  EXPLAINED WE CANNOT GIVE THESE TO THEM.  CALLED PT AND ASKED THAT HE RETRIEVE THE RECORDS AND TAKE TO HIS APPT WITH DR Dierdre Forth.

## 2012-02-22 DIAGNOSIS — D596 Hemoglobinuria due to hemolysis from other external causes: Secondary | ICD-10-CM | POA: Diagnosis not present

## 2012-02-28 DIAGNOSIS — D596 Hemoglobinuria due to hemolysis from other external causes: Secondary | ICD-10-CM | POA: Diagnosis not present

## 2012-03-08 DIAGNOSIS — D596 Hemoglobinuria due to hemolysis from other external causes: Secondary | ICD-10-CM | POA: Diagnosis not present

## 2012-03-13 DIAGNOSIS — D596 Hemoglobinuria due to hemolysis from other external causes: Secondary | ICD-10-CM | POA: Diagnosis not present

## 2012-03-20 DIAGNOSIS — D619 Aplastic anemia, unspecified: Secondary | ICD-10-CM | POA: Diagnosis not present

## 2012-03-20 DIAGNOSIS — D596 Hemoglobinuria due to hemolysis from other external causes: Secondary | ICD-10-CM | POA: Diagnosis not present

## 2012-03-24 DIAGNOSIS — D596 Hemoglobinuria due to hemolysis from other external causes: Secondary | ICD-10-CM | POA: Diagnosis not present

## 2012-03-24 DIAGNOSIS — D61818 Other pancytopenia: Secondary | ICD-10-CM | POA: Diagnosis not present

## 2012-03-24 DIAGNOSIS — M19019 Primary osteoarthritis, unspecified shoulder: Secondary | ICD-10-CM | POA: Diagnosis not present

## 2012-04-05 DIAGNOSIS — N179 Acute kidney failure, unspecified: Secondary | ICD-10-CM | POA: Diagnosis not present

## 2012-04-05 DIAGNOSIS — N19 Unspecified kidney failure: Secondary | ICD-10-CM | POA: Diagnosis not present

## 2012-04-05 DIAGNOSIS — D596 Hemoglobinuria due to hemolysis from other external causes: Secondary | ICD-10-CM | POA: Diagnosis not present

## 2012-04-10 DIAGNOSIS — D596 Hemoglobinuria due to hemolysis from other external causes: Secondary | ICD-10-CM | POA: Diagnosis not present

## 2012-04-12 DIAGNOSIS — N179 Acute kidney failure, unspecified: Secondary | ICD-10-CM | POA: Diagnosis not present

## 2012-04-12 DIAGNOSIS — Q619 Cystic kidney disease, unspecified: Secondary | ICD-10-CM | POA: Diagnosis not present

## 2012-04-12 DIAGNOSIS — N281 Cyst of kidney, acquired: Secondary | ICD-10-CM | POA: Insufficient documentation

## 2012-04-12 DIAGNOSIS — T451X5A Adverse effect of antineoplastic and immunosuppressive drugs, initial encounter: Secondary | ICD-10-CM | POA: Insufficient documentation

## 2012-04-12 DIAGNOSIS — D619 Aplastic anemia, unspecified: Secondary | ICD-10-CM | POA: Diagnosis not present

## 2012-04-12 DIAGNOSIS — R7989 Other specified abnormal findings of blood chemistry: Secondary | ICD-10-CM | POA: Diagnosis not present

## 2012-04-12 DIAGNOSIS — M19019 Primary osteoarthritis, unspecified shoulder: Secondary | ICD-10-CM | POA: Diagnosis not present

## 2012-04-12 DIAGNOSIS — T50995A Adverse effect of other drugs, medicaments and biological substances, initial encounter: Secondary | ICD-10-CM | POA: Diagnosis not present

## 2012-04-12 DIAGNOSIS — D61818 Other pancytopenia: Secondary | ICD-10-CM | POA: Diagnosis not present

## 2012-04-19 DIAGNOSIS — D596 Hemoglobinuria due to hemolysis from other external causes: Secondary | ICD-10-CM | POA: Diagnosis not present

## 2012-04-24 DIAGNOSIS — D596 Hemoglobinuria due to hemolysis from other external causes: Secondary | ICD-10-CM | POA: Diagnosis not present

## 2012-04-28 DIAGNOSIS — E291 Testicular hypofunction: Secondary | ICD-10-CM | POA: Diagnosis not present

## 2012-04-28 DIAGNOSIS — M2550 Pain in unspecified joint: Secondary | ICD-10-CM | POA: Diagnosis not present

## 2012-04-28 DIAGNOSIS — D619 Aplastic anemia, unspecified: Secondary | ICD-10-CM | POA: Diagnosis not present

## 2012-04-28 DIAGNOSIS — E785 Hyperlipidemia, unspecified: Secondary | ICD-10-CM | POA: Diagnosis not present

## 2012-05-01 DIAGNOSIS — D596 Hemoglobinuria due to hemolysis from other external causes: Secondary | ICD-10-CM | POA: Diagnosis not present

## 2012-05-03 DIAGNOSIS — D619 Aplastic anemia, unspecified: Secondary | ICD-10-CM | POA: Diagnosis not present

## 2012-05-03 DIAGNOSIS — D596 Hemoglobinuria due to hemolysis from other external causes: Secondary | ICD-10-CM | POA: Diagnosis not present

## 2012-05-03 DIAGNOSIS — N19 Unspecified kidney failure: Secondary | ICD-10-CM | POA: Diagnosis not present

## 2012-05-12 DIAGNOSIS — E785 Hyperlipidemia, unspecified: Secondary | ICD-10-CM | POA: Diagnosis not present

## 2012-05-12 DIAGNOSIS — E291 Testicular hypofunction: Secondary | ICD-10-CM | POA: Diagnosis not present

## 2012-05-25 DIAGNOSIS — E291 Testicular hypofunction: Secondary | ICD-10-CM | POA: Diagnosis not present

## 2012-05-31 DIAGNOSIS — D619 Aplastic anemia, unspecified: Secondary | ICD-10-CM | POA: Diagnosis not present

## 2012-05-31 DIAGNOSIS — D596 Hemoglobinuria due to hemolysis from other external causes: Secondary | ICD-10-CM | POA: Diagnosis not present

## 2012-06-15 DIAGNOSIS — R42 Dizziness and giddiness: Secondary | ICD-10-CM | POA: Diagnosis not present

## 2012-06-15 DIAGNOSIS — E291 Testicular hypofunction: Secondary | ICD-10-CM | POA: Diagnosis not present

## 2012-06-15 DIAGNOSIS — D619 Aplastic anemia, unspecified: Secondary | ICD-10-CM | POA: Diagnosis not present

## 2012-06-20 DIAGNOSIS — D619 Aplastic anemia, unspecified: Secondary | ICD-10-CM | POA: Diagnosis not present

## 2012-06-28 DIAGNOSIS — M19019 Primary osteoarthritis, unspecified shoulder: Secondary | ICD-10-CM | POA: Diagnosis not present

## 2012-06-28 DIAGNOSIS — E291 Testicular hypofunction: Secondary | ICD-10-CM | POA: Diagnosis not present

## 2012-07-05 DIAGNOSIS — D596 Hemoglobinuria due to hemolysis from other external causes: Secondary | ICD-10-CM | POA: Diagnosis not present

## 2012-07-05 DIAGNOSIS — D619 Aplastic anemia, unspecified: Secondary | ICD-10-CM | POA: Diagnosis not present

## 2012-07-05 DIAGNOSIS — Z79899 Other long term (current) drug therapy: Secondary | ICD-10-CM | POA: Diagnosis not present

## 2012-07-05 DIAGNOSIS — R5381 Other malaise: Secondary | ICD-10-CM | POA: Diagnosis not present

## 2012-07-27 DIAGNOSIS — E291 Testicular hypofunction: Secondary | ICD-10-CM | POA: Diagnosis not present

## 2012-07-27 IMAGING — CR DG ELBOW COMPLETE 3+V*L*
3 series · 3 of 3 positions shown · non-contrast
Comparison: None.

CLINICAL DATA: Left elbow pain

LEFT ELBOW - COMPLETE 3+ VIEW

[AP]
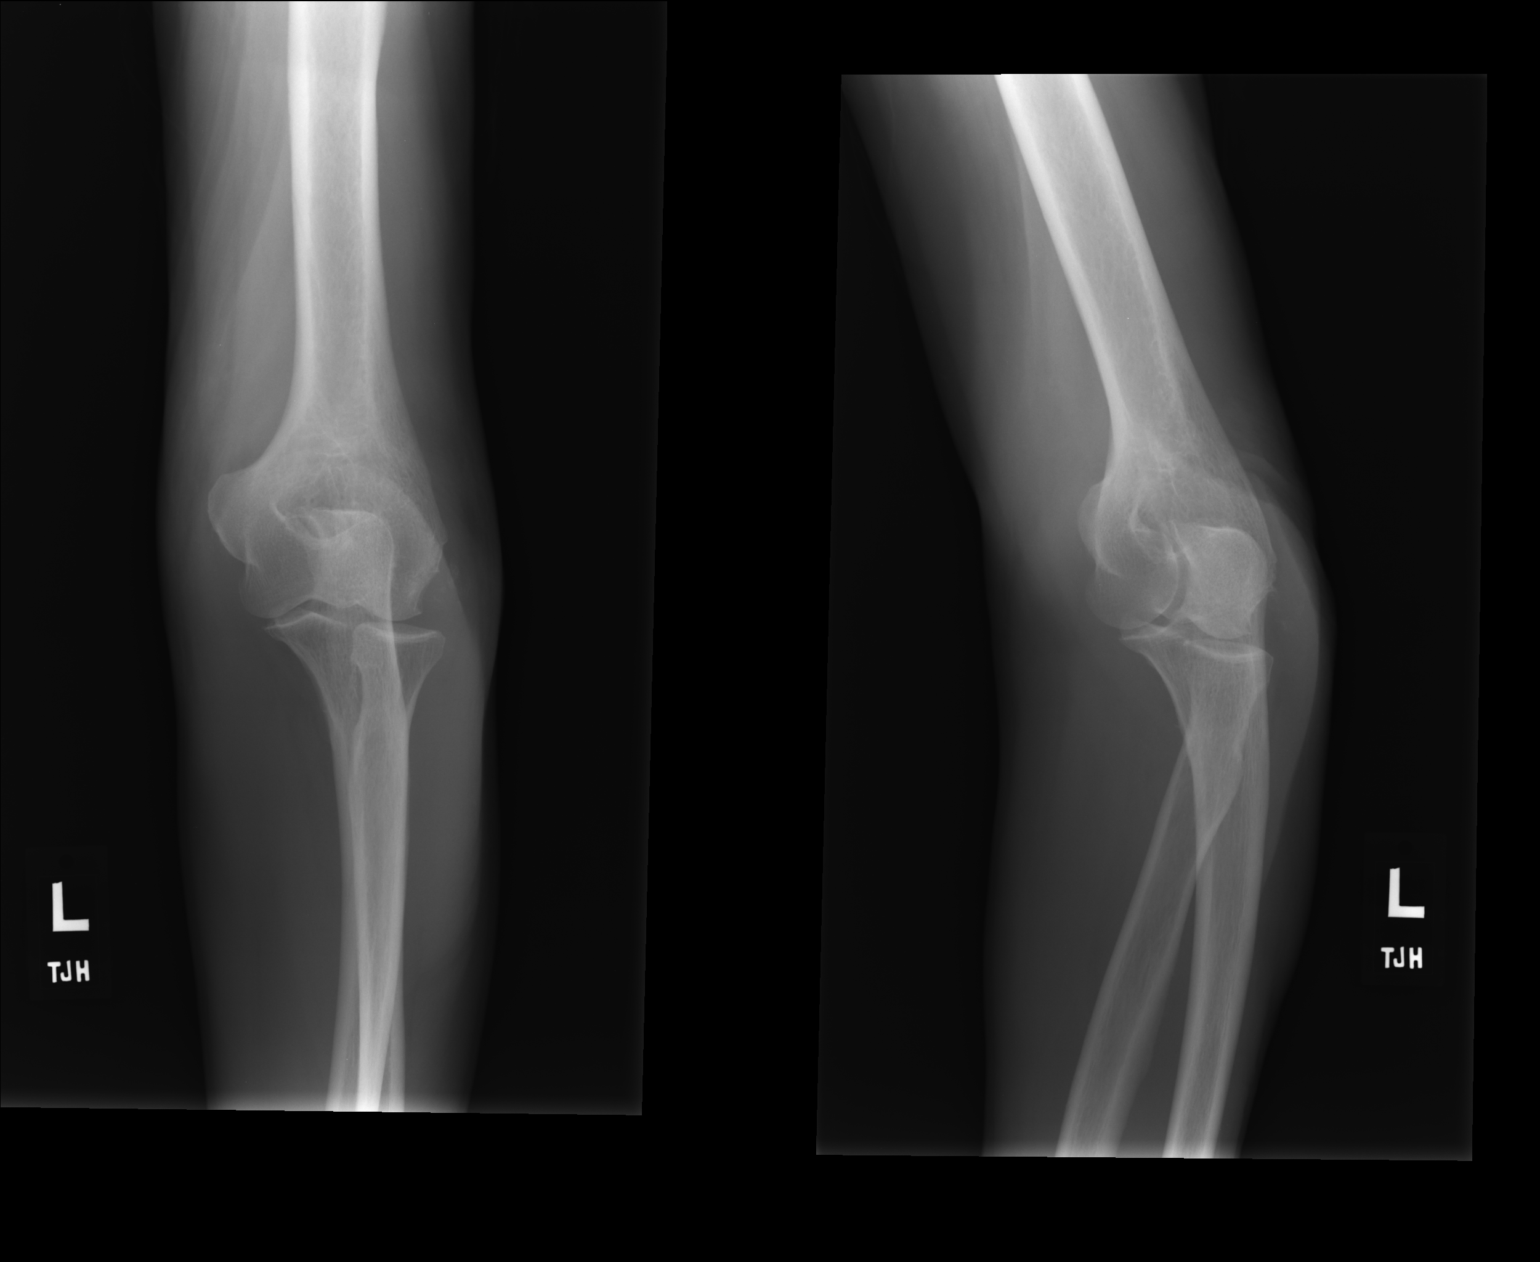

[lateral]
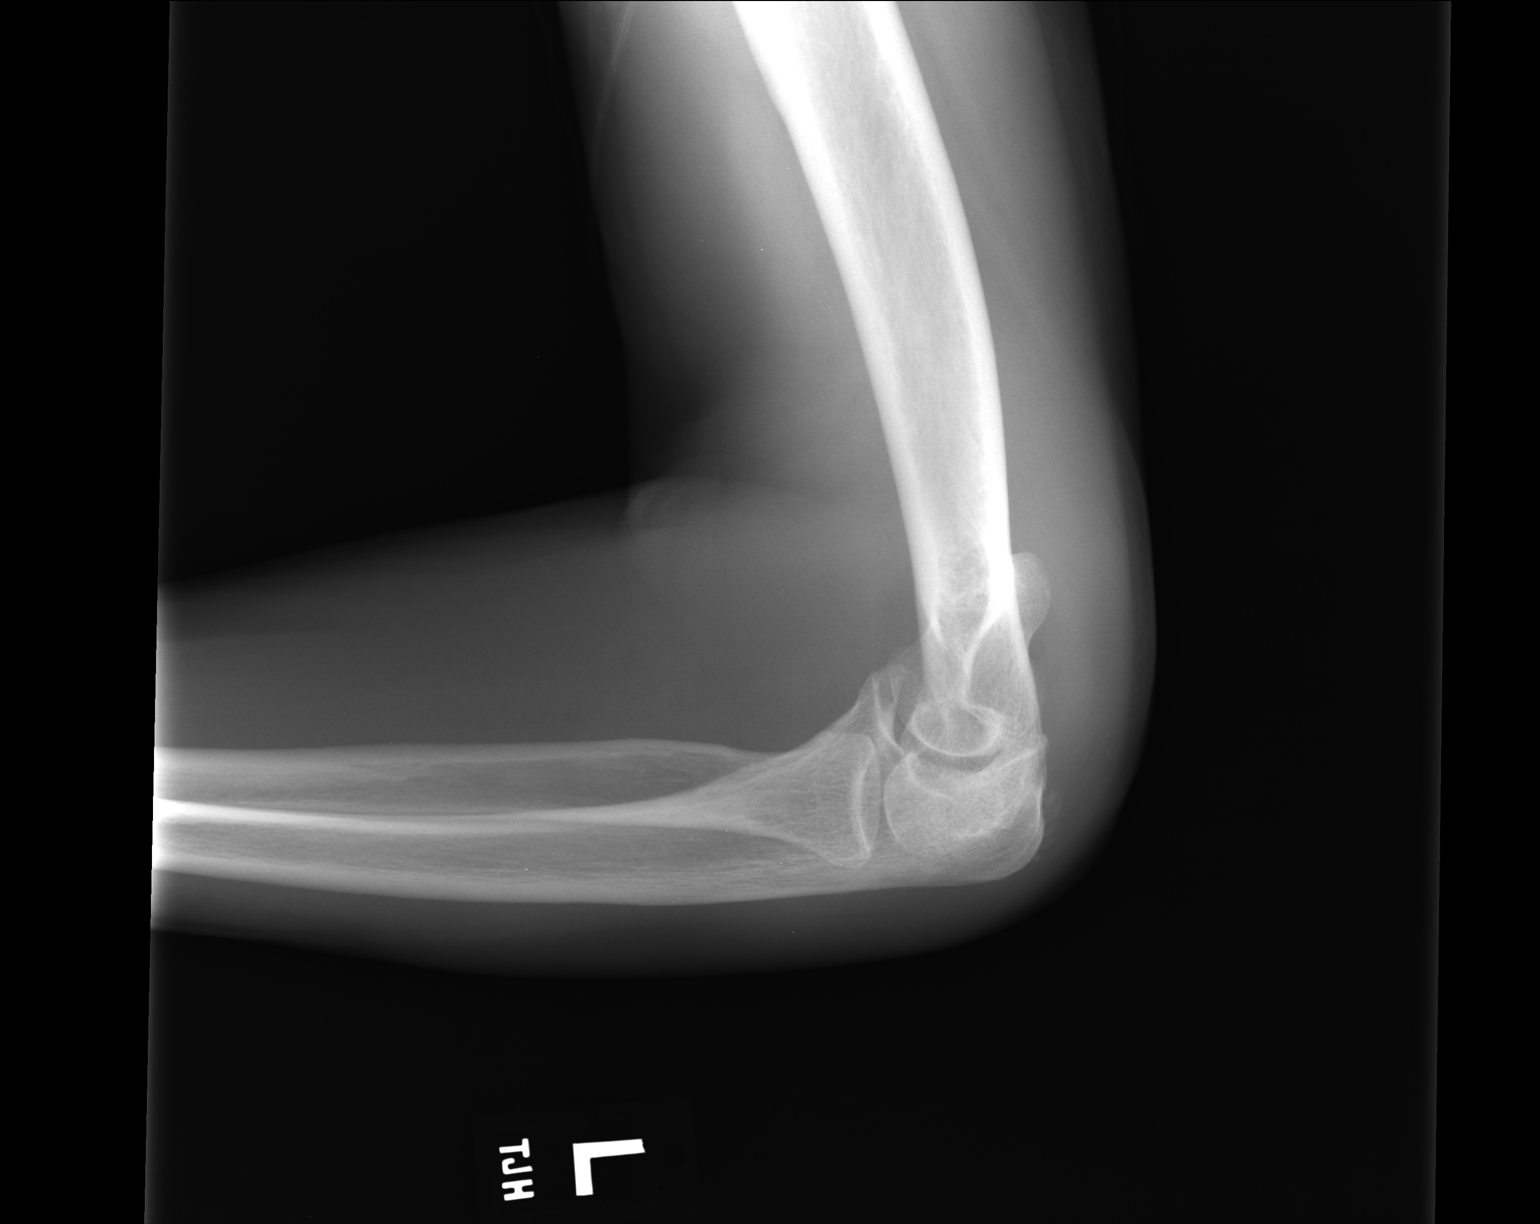

[ap obl ext rot]
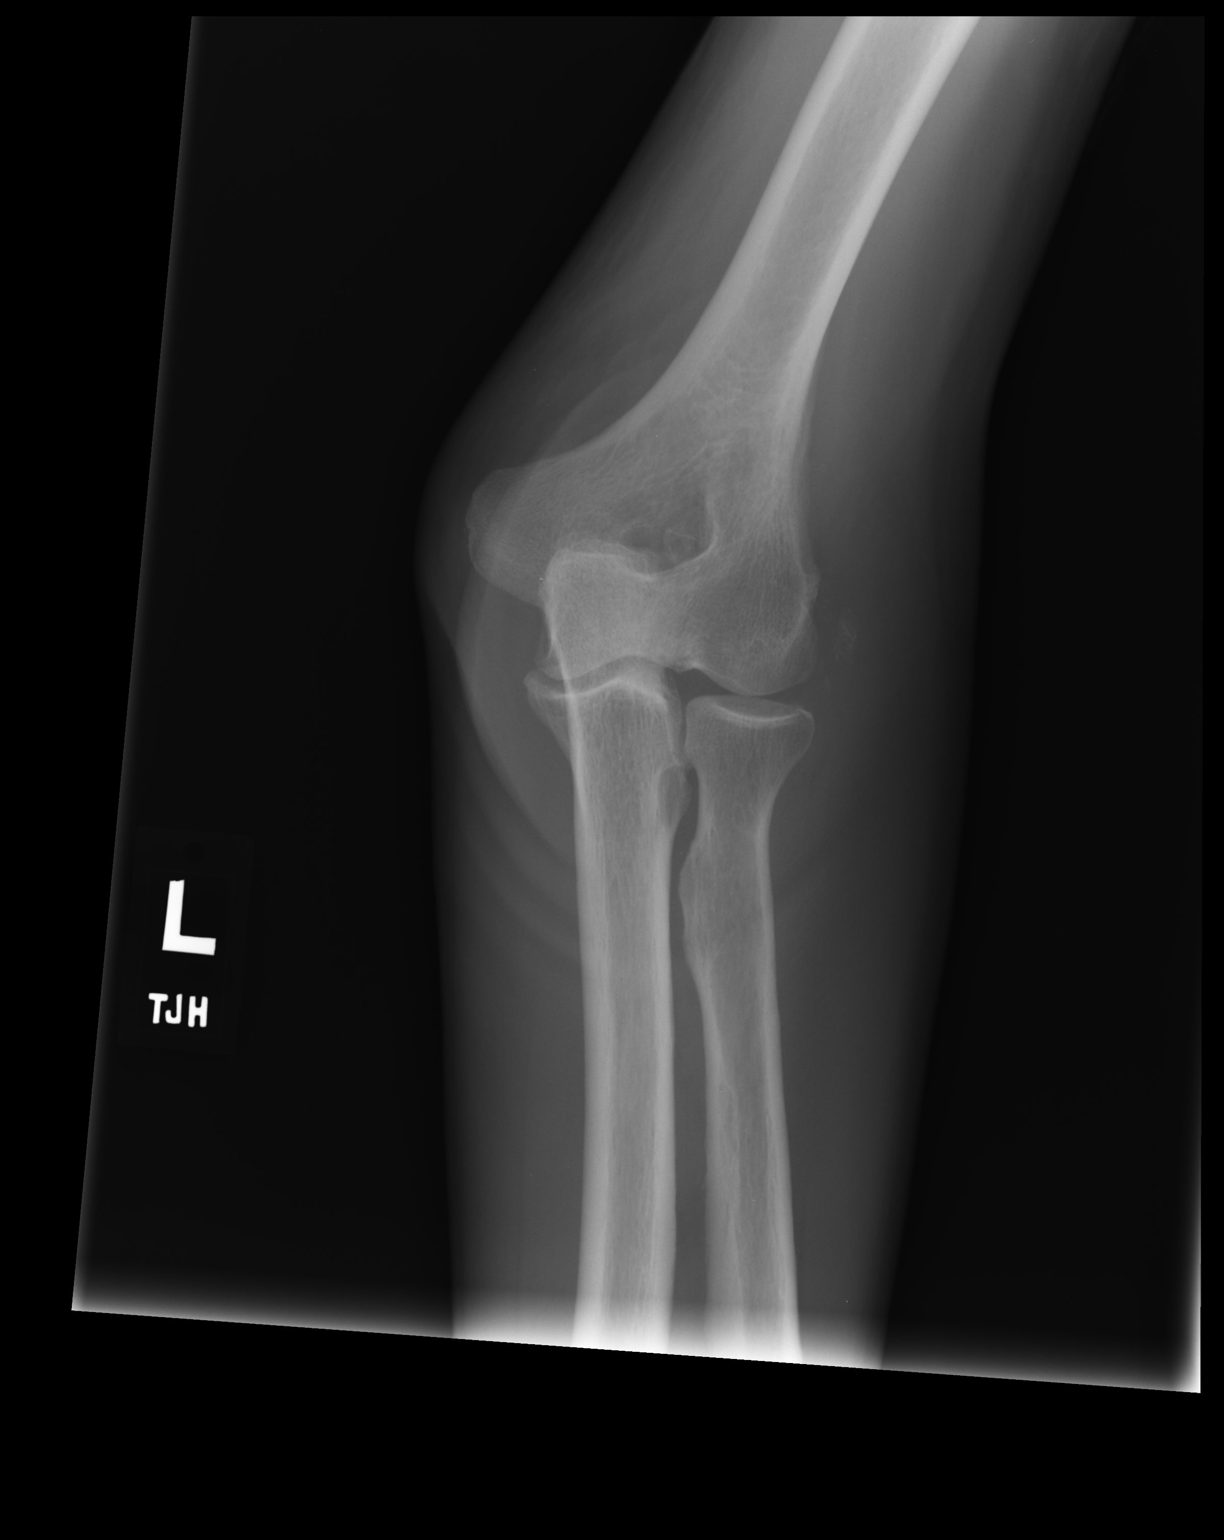

[3 of 3 positions shown; findings below may reference images not displayed]

FINDINGS: No fracture or dislocation is seen.

Mild degenerative changes.

The visualized soft tissues are unremarkable.

No displaced elbow joint fat pads to suggest an elbow joint
effusion.
IMPRESSION: No fracture or dislocation is seen.

Mild degenerative changes.

Clinically significant discrepancy from primary report, if
provided: None

## 2012-07-27 IMAGING — CR DG ELBOW COMPLETE 3+V*R*
3 series · 3 of 3 positions shown · non-contrast
Comparison: None.

CLINICAL DATA: Left elbow pain

RIGHT ELBOW - COMPLETE 3+ VIEW

[AP]
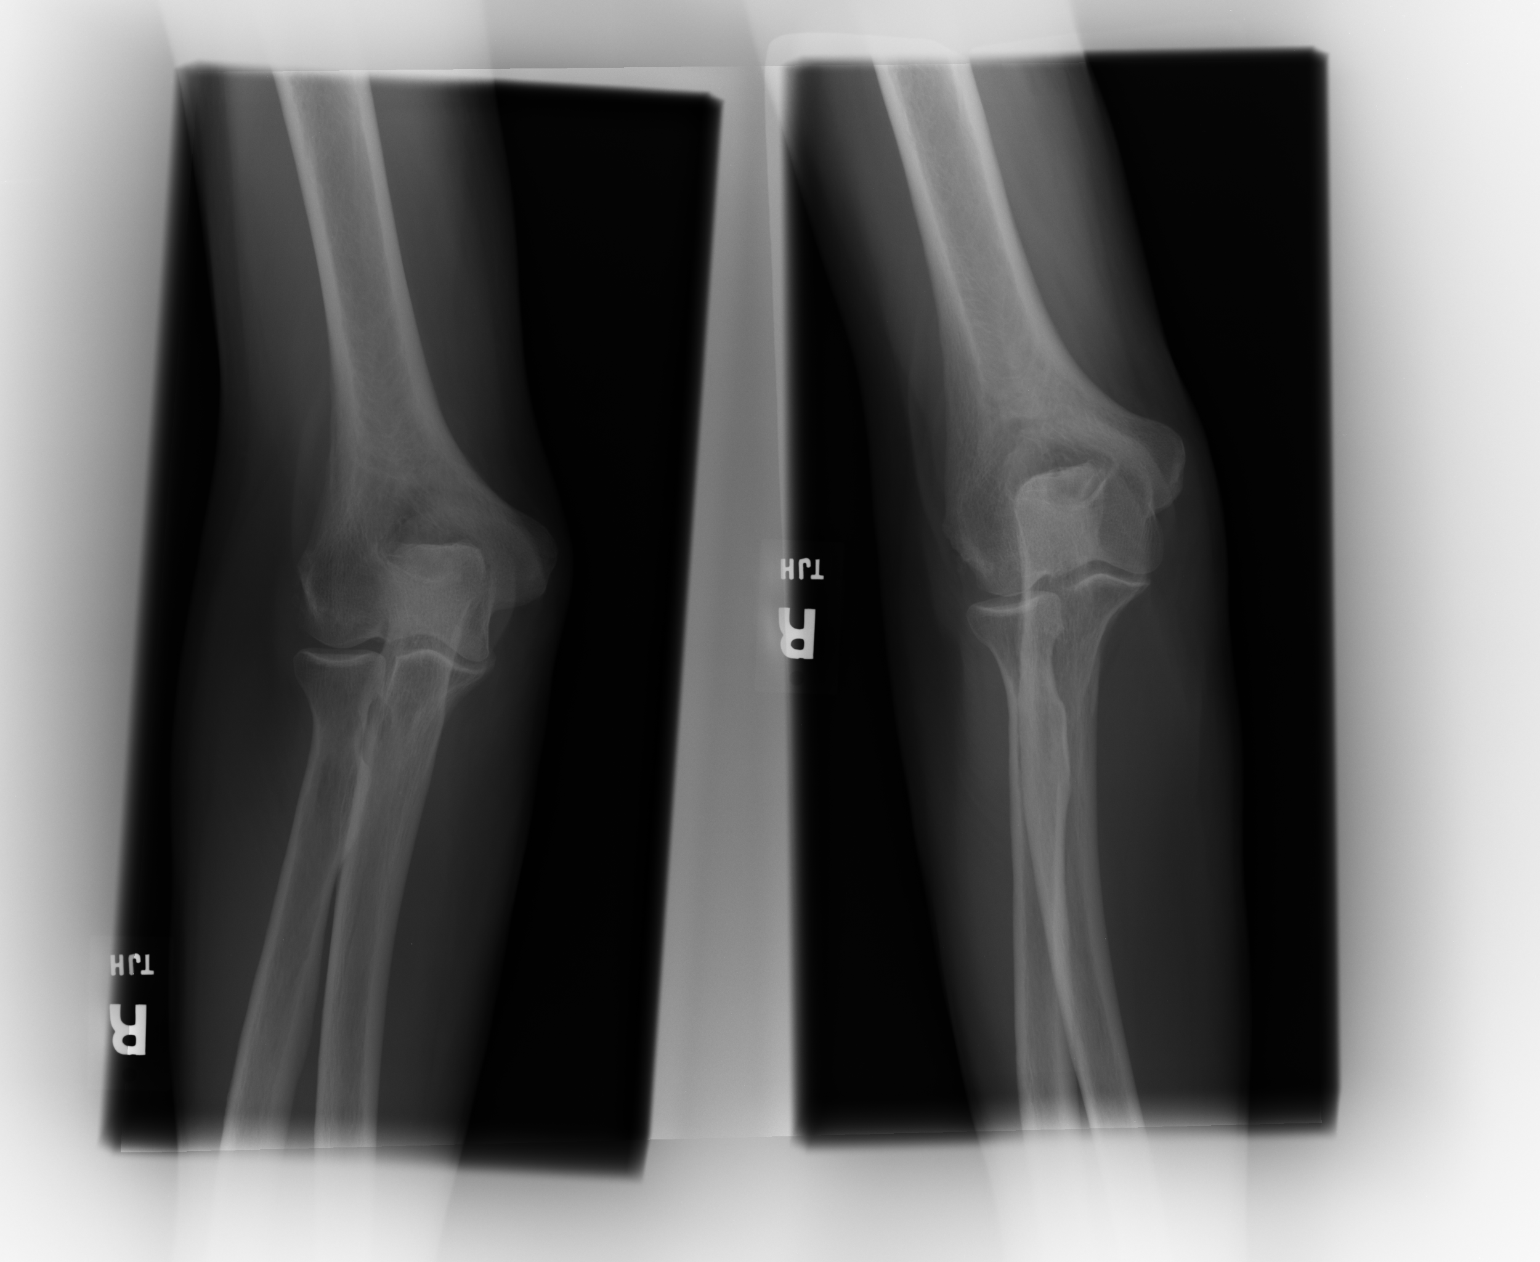

[lateral]
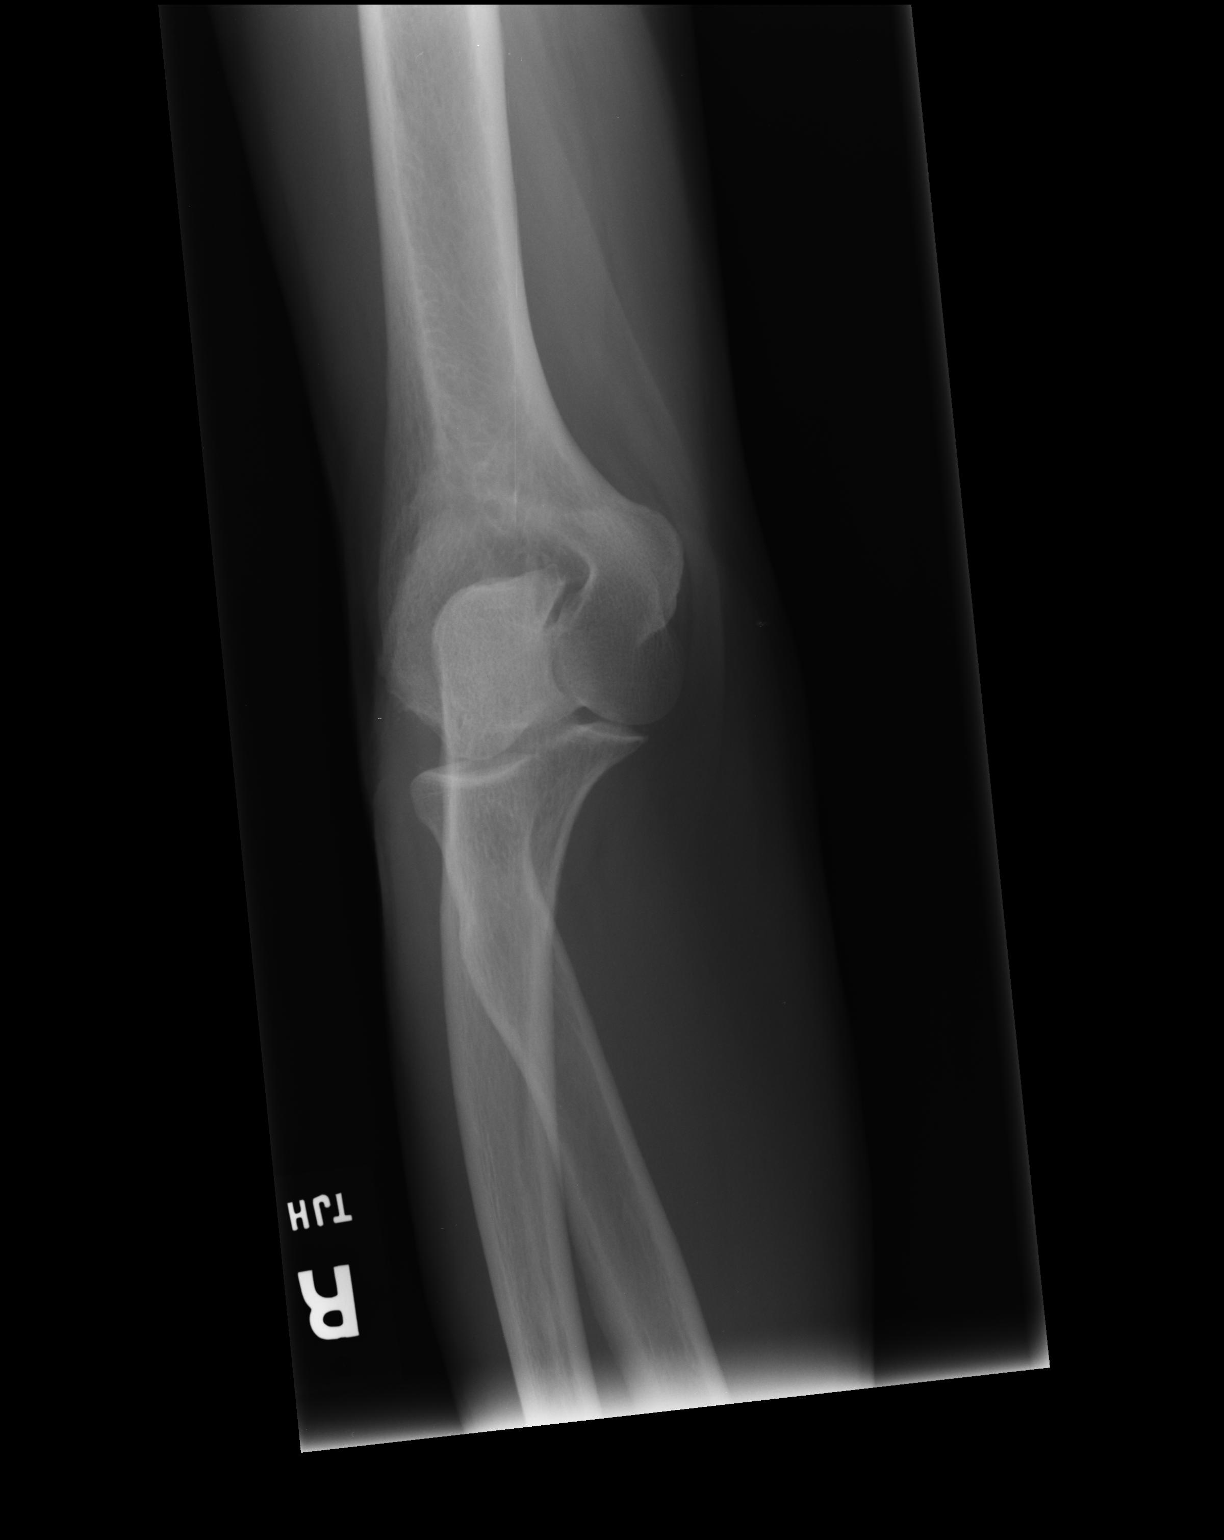

[ap obl ext rot]
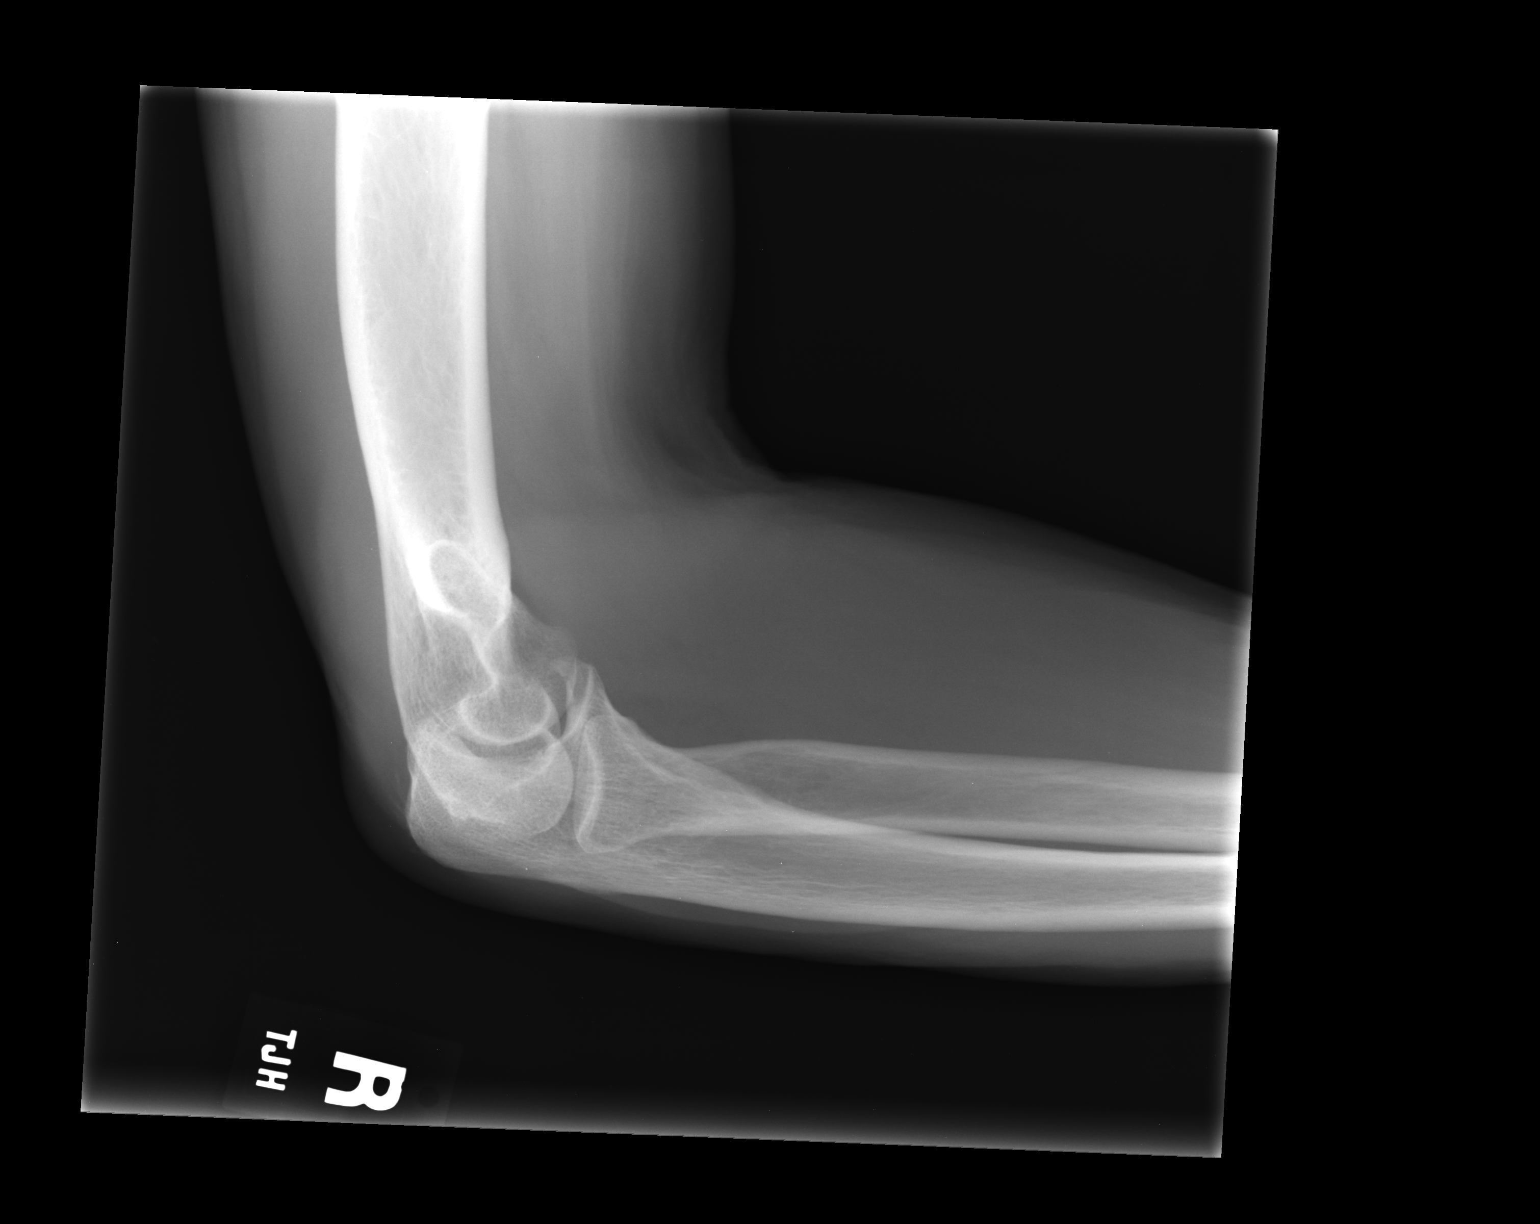

[3 of 3 positions shown; findings below may reference images not displayed]

FINDINGS: No fracture or dislocation is seen.

The joint spaces are preserved.

The visualized soft tissues are unremarkable.

No displaced elbow joint fat pads to suggest an elbow joint
effusion.
IMPRESSION: No acute osseous abnormality is seen.

Clinically significant discrepancy from primary report, if
provided: None

## 2012-07-27 IMAGING — CR DG CERVICAL SPINE 2 OR 3 VIEWS
2 series · 2 of 2 positions shown · non-contrast
Comparison: None.

CLINICAL DATA: Neck pain

CERVICAL SPINE - 2-3 VIEW

[lateral]
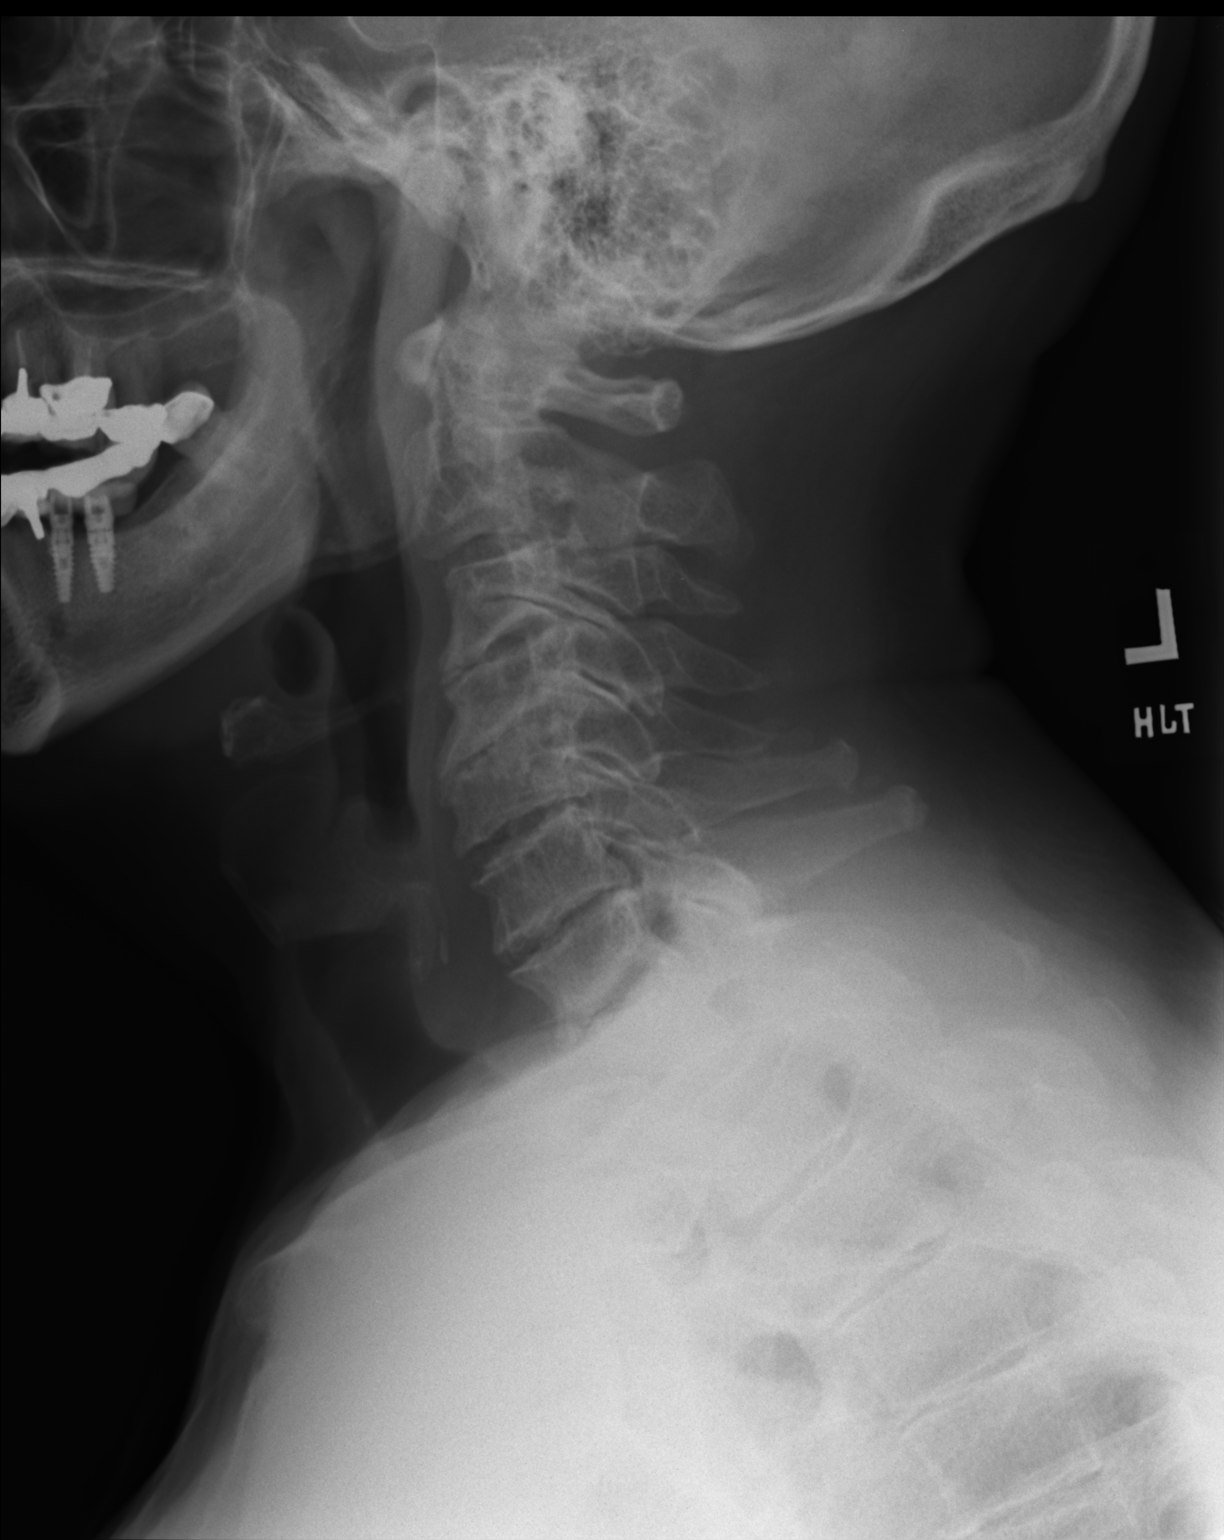

[AP]
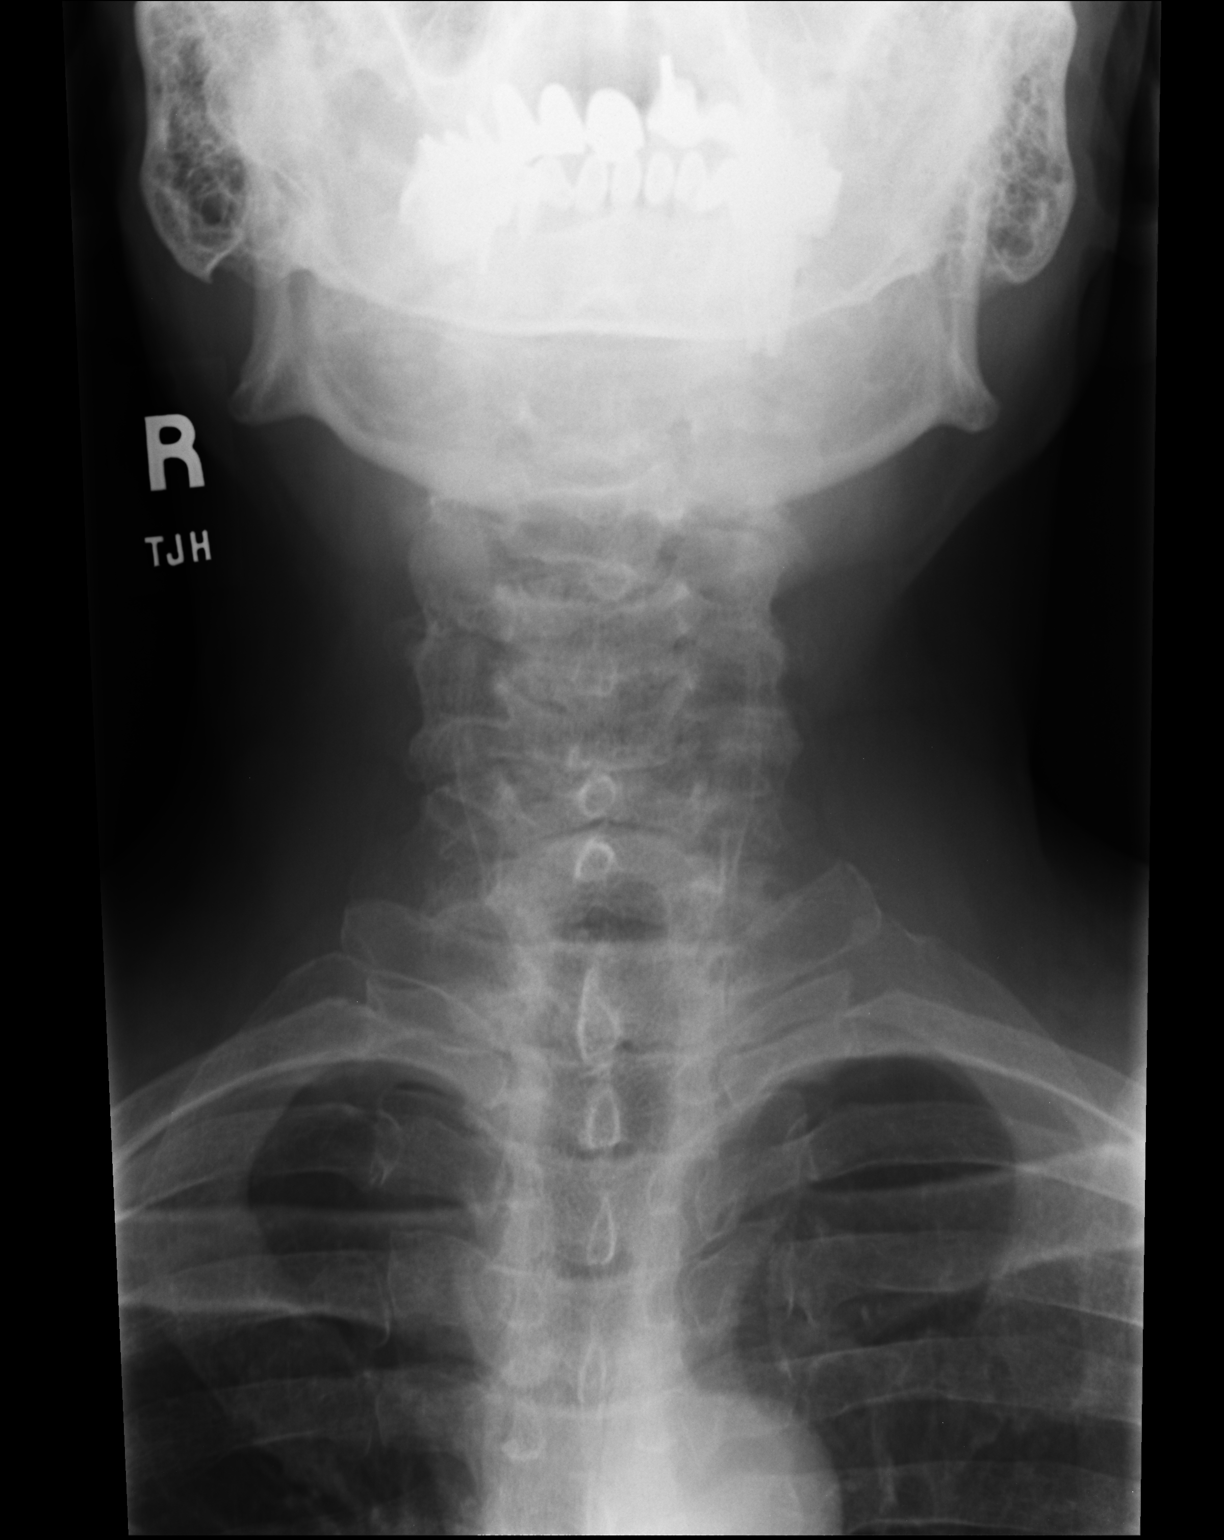

[2 of 2 positions shown; findings below may reference images not displayed]

FINDINGS: Cervical spine is visualized to the bottom of C7 on the
lateral view.

No evidence of fracture or dislocation.  No prevertebral soft
tissue swelling.

Moderate multilevel degenerative changes.

An odontoid view was not performed.
IMPRESSION: No evidence of fracture or dislocation.

Moderate multilevel degenerative changes.

Clinically significant discrepancy from primary report, if
provided: None

## 2012-08-11 DIAGNOSIS — D619 Aplastic anemia, unspecified: Secondary | ICD-10-CM | POA: Diagnosis not present

## 2012-08-15 DIAGNOSIS — E291 Testicular hypofunction: Secondary | ICD-10-CM | POA: Diagnosis not present

## 2012-08-15 DIAGNOSIS — Z23 Encounter for immunization: Secondary | ICD-10-CM | POA: Diagnosis not present

## 2012-08-15 DIAGNOSIS — E785 Hyperlipidemia, unspecified: Secondary | ICD-10-CM | POA: Diagnosis not present

## 2012-08-15 DIAGNOSIS — D619 Aplastic anemia, unspecified: Secondary | ICD-10-CM | POA: Diagnosis not present

## 2012-08-30 DIAGNOSIS — D619 Aplastic anemia, unspecified: Secondary | ICD-10-CM | POA: Diagnosis not present

## 2012-08-30 DIAGNOSIS — R5381 Other malaise: Secondary | ICD-10-CM | POA: Diagnosis not present

## 2012-08-30 DIAGNOSIS — E291 Testicular hypofunction: Secondary | ICD-10-CM | POA: Diagnosis not present

## 2012-08-30 DIAGNOSIS — M19019 Primary osteoarthritis, unspecified shoulder: Secondary | ICD-10-CM | POA: Diagnosis not present

## 2012-08-30 DIAGNOSIS — Z79899 Other long term (current) drug therapy: Secondary | ICD-10-CM | POA: Diagnosis not present

## 2012-09-13 DIAGNOSIS — E291 Testicular hypofunction: Secondary | ICD-10-CM | POA: Diagnosis not present

## 2012-09-25 DIAGNOSIS — M19019 Primary osteoarthritis, unspecified shoulder: Secondary | ICD-10-CM | POA: Diagnosis not present

## 2012-10-06 DIAGNOSIS — D619 Aplastic anemia, unspecified: Secondary | ICD-10-CM | POA: Diagnosis not present

## 2012-10-09 DIAGNOSIS — M25419 Effusion, unspecified shoulder: Secondary | ICD-10-CM | POA: Diagnosis not present

## 2012-10-09 DIAGNOSIS — M19019 Primary osteoarthritis, unspecified shoulder: Secondary | ICD-10-CM | POA: Diagnosis not present

## 2012-10-09 DIAGNOSIS — M75100 Unspecified rotator cuff tear or rupture of unspecified shoulder, not specified as traumatic: Secondary | ICD-10-CM | POA: Diagnosis not present

## 2012-10-10 DIAGNOSIS — E291 Testicular hypofunction: Secondary | ICD-10-CM | POA: Diagnosis not present

## 2012-10-26 ENCOUNTER — Telehealth: Payer: Self-pay

## 2012-10-26 NOTE — Telephone Encounter (Signed)
[facility] STATES THEY NEED THE LAST EKG DONE ON PT PLEASE FAX TO [phone] AND YOU MAY REACH THEM AT [phone]

## 2012-11-07 DIAGNOSIS — Z96659 Presence of unspecified artificial knee joint: Secondary | ICD-10-CM | POA: Diagnosis not present

## 2012-11-07 DIAGNOSIS — Z87891 Personal history of nicotine dependence: Secondary | ICD-10-CM | POA: Diagnosis not present

## 2012-11-07 DIAGNOSIS — D596 Hemoglobinuria due to hemolysis from other external causes: Secondary | ICD-10-CM | POA: Diagnosis present

## 2012-11-07 DIAGNOSIS — D6189 Other specified aplastic anemias and other bone marrow failure syndromes: Secondary | ICD-10-CM | POA: Diagnosis present

## 2012-11-07 DIAGNOSIS — N189 Chronic kidney disease, unspecified: Secondary | ICD-10-CM | POA: Diagnosis present

## 2012-11-07 DIAGNOSIS — M159 Polyosteoarthritis, unspecified: Secondary | ICD-10-CM | POA: Diagnosis present

## 2012-11-07 DIAGNOSIS — M19019 Primary osteoarthritis, unspecified shoulder: Secondary | ICD-10-CM | POA: Diagnosis present

## 2012-11-07 DIAGNOSIS — N400 Enlarged prostate without lower urinary tract symptoms: Secondary | ICD-10-CM | POA: Diagnosis present

## 2012-11-07 DIAGNOSIS — I10 Essential (primary) hypertension: Secondary | ICD-10-CM | POA: Diagnosis not present

## 2012-11-07 DIAGNOSIS — Z96649 Presence of unspecified artificial hip joint: Secondary | ICD-10-CM | POA: Diagnosis not present

## 2012-11-20 DIAGNOSIS — Z471 Aftercare following joint replacement surgery: Secondary | ICD-10-CM | POA: Diagnosis not present

## 2012-11-20 DIAGNOSIS — M19019 Primary osteoarthritis, unspecified shoulder: Secondary | ICD-10-CM | POA: Diagnosis not present

## 2012-11-20 DIAGNOSIS — Z96619 Presence of unspecified artificial shoulder joint: Secondary | ICD-10-CM | POA: Diagnosis not present

## 2012-11-29 DIAGNOSIS — D619 Aplastic anemia, unspecified: Secondary | ICD-10-CM | POA: Diagnosis not present

## 2012-12-19 DIAGNOSIS — R42 Dizziness and giddiness: Secondary | ICD-10-CM | POA: Diagnosis not present

## 2012-12-19 DIAGNOSIS — E291 Testicular hypofunction: Secondary | ICD-10-CM | POA: Diagnosis not present

## 2012-12-22 ENCOUNTER — Other Ambulatory Visit (HOSPITAL_COMMUNITY): Payer: Self-pay | Admitting: Endocrinology

## 2012-12-22 ENCOUNTER — Ambulatory Visit (HOSPITAL_COMMUNITY)
Admission: RE | Admit: 2012-12-22 | Discharge: 2012-12-22 | Disposition: A | Discharge status: Home / Self Care | Payer: 59 | Source: Ambulatory Visit | Attending: Cardiovascular Disease | Admitting: Cardiovascular Disease

## 2012-12-22 DIAGNOSIS — I079 Rheumatic tricuspid valve disease, unspecified: Secondary | ICD-10-CM | POA: Diagnosis not present

## 2012-12-22 DIAGNOSIS — R42 Dizziness and giddiness: Secondary | ICD-10-CM | POA: Diagnosis not present

## 2012-12-22 DIAGNOSIS — I059 Rheumatic mitral valve disease, unspecified: Secondary | ICD-10-CM | POA: Insufficient documentation

## 2012-12-22 DIAGNOSIS — J449 Chronic obstructive pulmonary disease, unspecified: Secondary | ICD-10-CM | POA: Diagnosis not present

## 2012-12-22 DIAGNOSIS — J4489 Other specified chronic obstructive pulmonary disease: Secondary | ICD-10-CM | POA: Insufficient documentation

## 2012-12-22 NOTE — Progress Notes (Signed)
[facility]   2D echo completed 12/22/2012.   Cindy Rigg, RDCS  

## 2012-12-25 DIAGNOSIS — M19019 Primary osteoarthritis, unspecified shoulder: Secondary | ICD-10-CM | POA: Diagnosis not present

## 2012-12-25 DIAGNOSIS — Z471 Aftercare following joint replacement surgery: Secondary | ICD-10-CM | POA: Diagnosis not present

## 2012-12-25 DIAGNOSIS — Z96619 Presence of unspecified artificial shoulder joint: Secondary | ICD-10-CM | POA: Diagnosis not present

## 2013-01-10 DIAGNOSIS — M25519 Pain in unspecified shoulder: Secondary | ICD-10-CM | POA: Diagnosis not present

## 2013-01-10 DIAGNOSIS — Z96619 Presence of unspecified artificial shoulder joint: Secondary | ICD-10-CM | POA: Diagnosis not present

## 2013-01-16 ENCOUNTER — Ambulatory Visit: Payer: Self-pay | Admitting: Physician Assistant

## 2013-01-16 DIAGNOSIS — S93699A Other sprain of unspecified foot, initial encounter: Secondary | ICD-10-CM | POA: Diagnosis not present

## 2013-01-16 DIAGNOSIS — M79609 Pain in unspecified limb: Secondary | ICD-10-CM | POA: Diagnosis not present

## 2013-01-16 DIAGNOSIS — M7989 Other specified soft tissue disorders: Secondary | ICD-10-CM | POA: Diagnosis not present

## 2013-01-17 DIAGNOSIS — Z96619 Presence of unspecified artificial shoulder joint: Secondary | ICD-10-CM | POA: Diagnosis not present

## 2013-01-17 DIAGNOSIS — M25519 Pain in unspecified shoulder: Secondary | ICD-10-CM | POA: Diagnosis not present

## 2013-01-18 DIAGNOSIS — E291 Testicular hypofunction: Secondary | ICD-10-CM | POA: Diagnosis not present

## 2013-01-19 DIAGNOSIS — M779 Enthesopathy, unspecified: Secondary | ICD-10-CM | POA: Diagnosis not present

## 2013-01-19 DIAGNOSIS — Q6689 Other  specified congenital deformities of feet: Secondary | ICD-10-CM | POA: Diagnosis not present

## 2013-01-19 DIAGNOSIS — M2010 Hallux valgus (acquired), unspecified foot: Secondary | ICD-10-CM | POA: Diagnosis not present

## 2013-01-19 DIAGNOSIS — M109 Gout, unspecified: Secondary | ICD-10-CM | POA: Diagnosis not present

## 2013-01-24 DIAGNOSIS — M25519 Pain in unspecified shoulder: Secondary | ICD-10-CM | POA: Diagnosis not present

## 2013-01-24 DIAGNOSIS — Z96619 Presence of unspecified artificial shoulder joint: Secondary | ICD-10-CM | POA: Diagnosis not present

## 2013-02-07 DIAGNOSIS — Z96619 Presence of unspecified artificial shoulder joint: Secondary | ICD-10-CM | POA: Diagnosis not present

## 2013-02-07 DIAGNOSIS — M25519 Pain in unspecified shoulder: Secondary | ICD-10-CM | POA: Diagnosis not present

## 2013-02-08 DIAGNOSIS — E291 Testicular hypofunction: Secondary | ICD-10-CM | POA: Diagnosis not present

## 2013-02-08 DIAGNOSIS — M779 Enthesopathy, unspecified: Secondary | ICD-10-CM | POA: Diagnosis not present

## 2013-02-14 DIAGNOSIS — Z96619 Presence of unspecified artificial shoulder joint: Secondary | ICD-10-CM | POA: Diagnosis not present

## 2013-02-14 DIAGNOSIS — M25519 Pain in unspecified shoulder: Secondary | ICD-10-CM | POA: Diagnosis not present

## 2013-02-21 DIAGNOSIS — Z96619 Presence of unspecified artificial shoulder joint: Secondary | ICD-10-CM | POA: Diagnosis not present

## 2013-02-21 DIAGNOSIS — M25519 Pain in unspecified shoulder: Secondary | ICD-10-CM | POA: Diagnosis not present

## 2013-03-01 DIAGNOSIS — E291 Testicular hypofunction: Secondary | ICD-10-CM | POA: Diagnosis not present

## 2013-03-07 DIAGNOSIS — Z96619 Presence of unspecified artificial shoulder joint: Secondary | ICD-10-CM | POA: Diagnosis not present

## 2013-03-07 DIAGNOSIS — M25519 Pain in unspecified shoulder: Secondary | ICD-10-CM | POA: Diagnosis not present

## 2013-03-14 DIAGNOSIS — M25519 Pain in unspecified shoulder: Secondary | ICD-10-CM | POA: Diagnosis not present

## 2013-03-14 DIAGNOSIS — Z96619 Presence of unspecified artificial shoulder joint: Secondary | ICD-10-CM | POA: Diagnosis not present

## 2013-03-15 DIAGNOSIS — E291 Testicular hypofunction: Secondary | ICD-10-CM | POA: Diagnosis not present

## 2013-03-21 DIAGNOSIS — D596 Hemoglobinuria due to hemolysis from other external causes: Secondary | ICD-10-CM | POA: Diagnosis not present

## 2013-03-21 DIAGNOSIS — D509 Iron deficiency anemia, unspecified: Secondary | ICD-10-CM | POA: Diagnosis not present

## 2013-03-21 DIAGNOSIS — N289 Disorder of kidney and ureter, unspecified: Secondary | ICD-10-CM | POA: Diagnosis not present

## 2013-03-21 DIAGNOSIS — R5381 Other malaise: Secondary | ICD-10-CM | POA: Diagnosis not present

## 2013-03-21 DIAGNOSIS — D619 Aplastic anemia, unspecified: Secondary | ICD-10-CM | POA: Diagnosis not present

## 2013-03-22 DIAGNOSIS — E611 Iron deficiency: Secondary | ICD-10-CM | POA: Insufficient documentation

## 2013-03-26 DIAGNOSIS — N401 Enlarged prostate with lower urinary tract symptoms: Secondary | ICD-10-CM | POA: Diagnosis not present

## 2013-03-26 DIAGNOSIS — E291 Testicular hypofunction: Secondary | ICD-10-CM | POA: Diagnosis not present

## 2013-03-26 DIAGNOSIS — D596 Hemoglobinuria due to hemolysis from other external causes: Secondary | ICD-10-CM | POA: Diagnosis not present

## 2013-03-26 DIAGNOSIS — E785 Hyperlipidemia, unspecified: Secondary | ICD-10-CM | POA: Diagnosis not present

## 2013-03-28 DIAGNOSIS — Z96619 Presence of unspecified artificial shoulder joint: Secondary | ICD-10-CM | POA: Diagnosis not present

## 2013-03-28 DIAGNOSIS — M25519 Pain in unspecified shoulder: Secondary | ICD-10-CM | POA: Diagnosis not present

## 2013-04-12 DIAGNOSIS — E291 Testicular hypofunction: Secondary | ICD-10-CM | POA: Diagnosis not present

## 2013-04-26 DIAGNOSIS — E291 Testicular hypofunction: Secondary | ICD-10-CM | POA: Diagnosis not present

## 2013-05-16 DIAGNOSIS — E291 Testicular hypofunction: Secondary | ICD-10-CM | POA: Diagnosis not present

## 2013-06-07 DIAGNOSIS — E291 Testicular hypofunction: Secondary | ICD-10-CM | POA: Diagnosis not present

## 2013-06-20 DIAGNOSIS — D619 Aplastic anemia, unspecified: Secondary | ICD-10-CM | POA: Diagnosis not present

## 2013-06-20 DIAGNOSIS — D509 Iron deficiency anemia, unspecified: Secondary | ICD-10-CM | POA: Diagnosis not present

## 2013-06-21 DIAGNOSIS — Z23 Encounter for immunization: Secondary | ICD-10-CM | POA: Diagnosis not present

## 2013-06-21 DIAGNOSIS — E291 Testicular hypofunction: Secondary | ICD-10-CM | POA: Diagnosis not present

## 2013-06-22 ENCOUNTER — Encounter: Payer: Self-pay | Admitting: Internal Medicine

## 2013-07-10 IMAGING — US US EXTREM LOW VENOUS*R*
1 series · 14 of 24 positions shown · non-contrast
Comparison: none

REASON FOR EXAM: Call Report [id number] eval for DVT  pain swelling
COMMENTS:

[Series 1: us extrem low venous*right* · 14 of 29 slices shown]
[im 1/29]
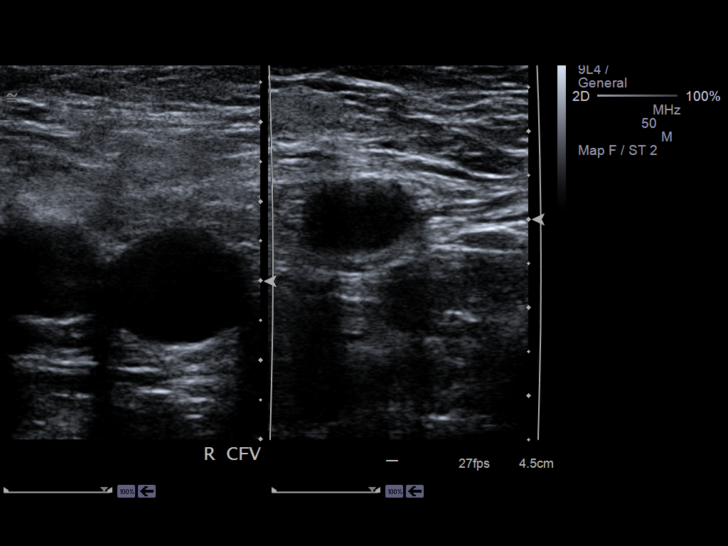
[im 3/29]
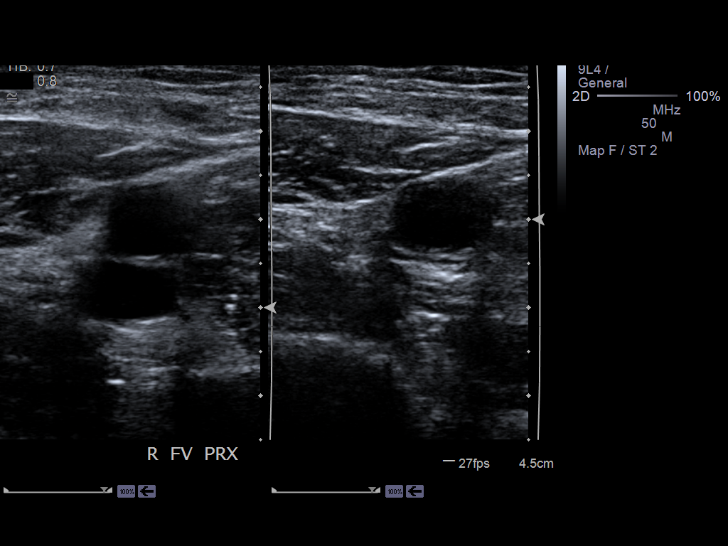
[im 5/29]
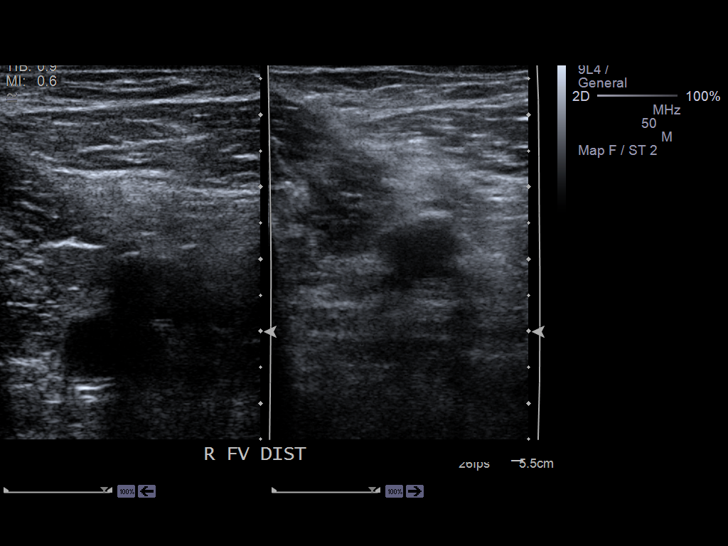
[im 8/29]
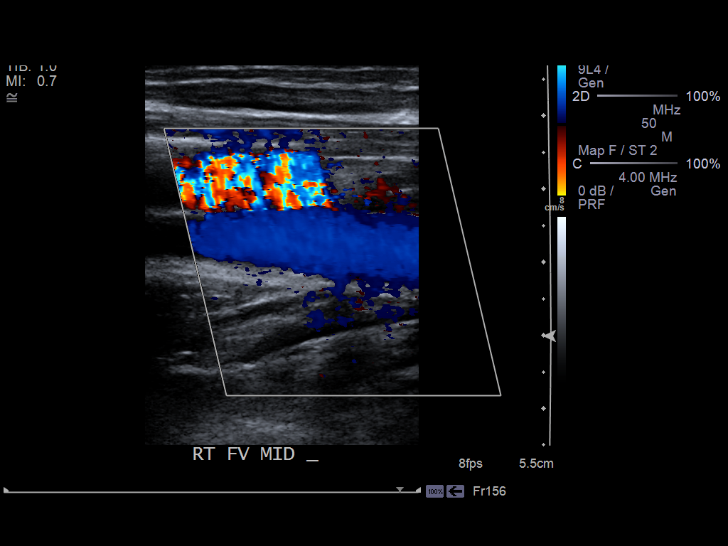
[im 9/29]
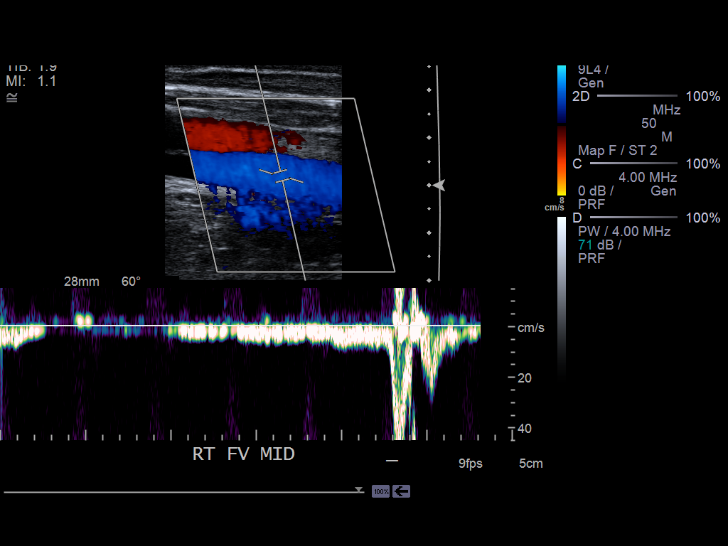
[im 11/29]
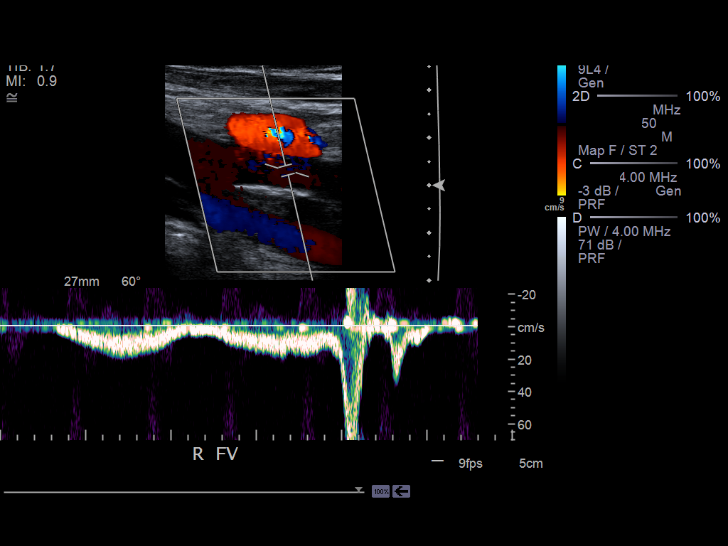
[im 14/29]
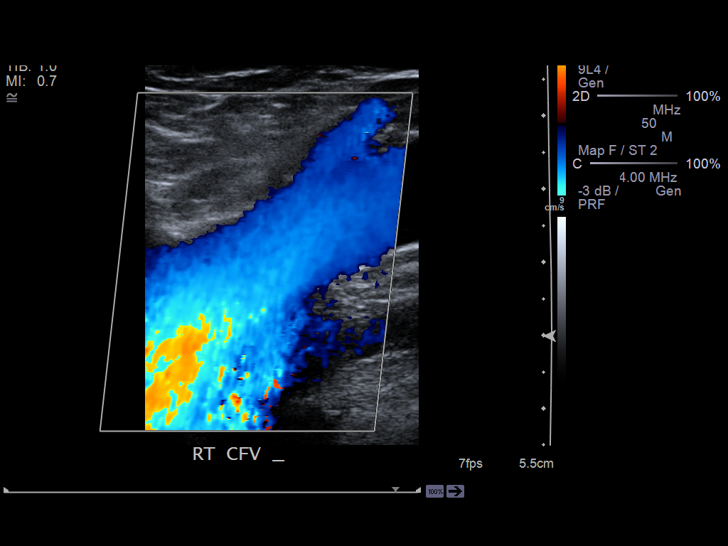
[im 15/29]
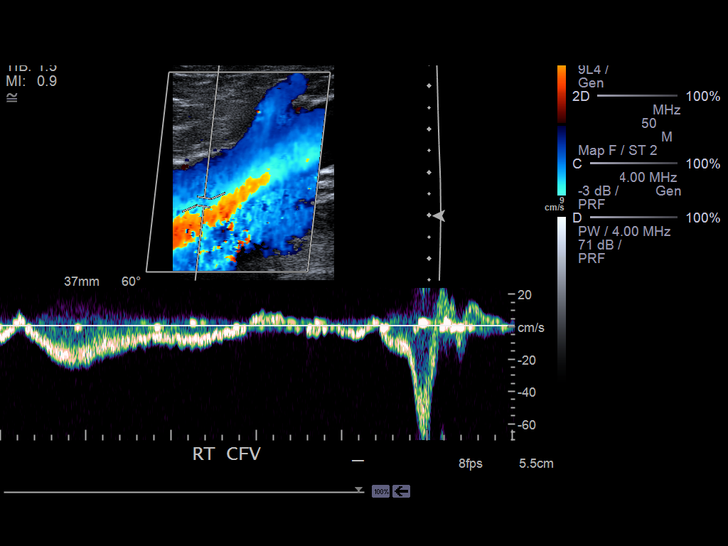
[im 18/29]
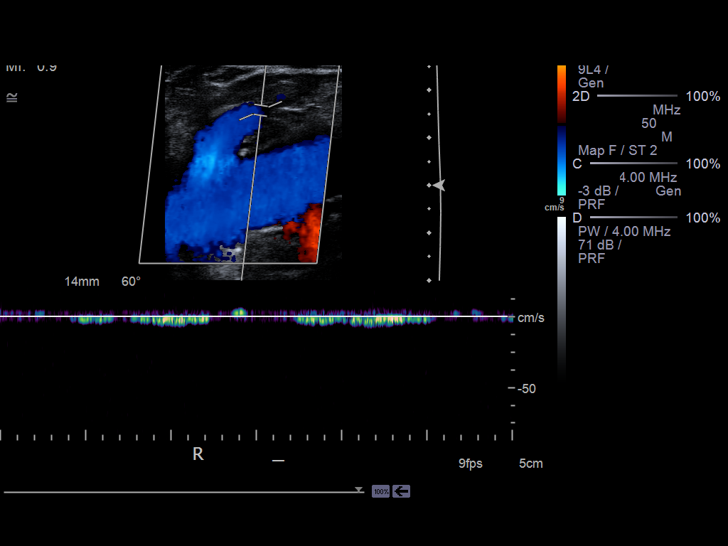
[im 20/29]
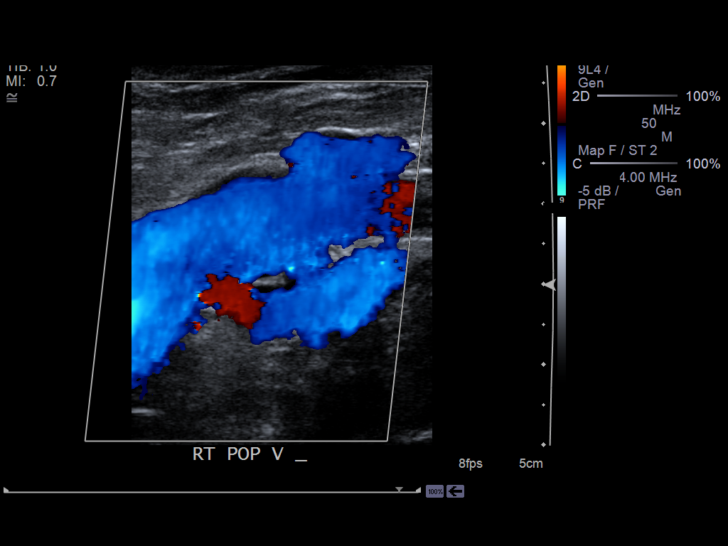
[im 22/29]
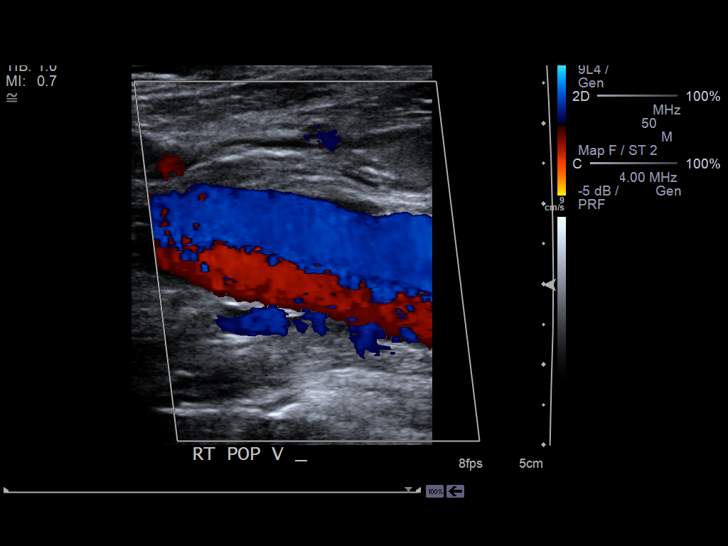
[im 24/29]
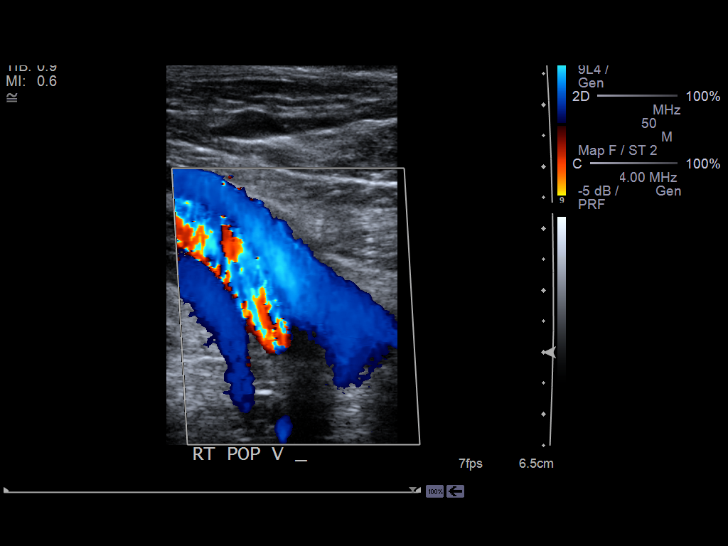
[im 26/29]
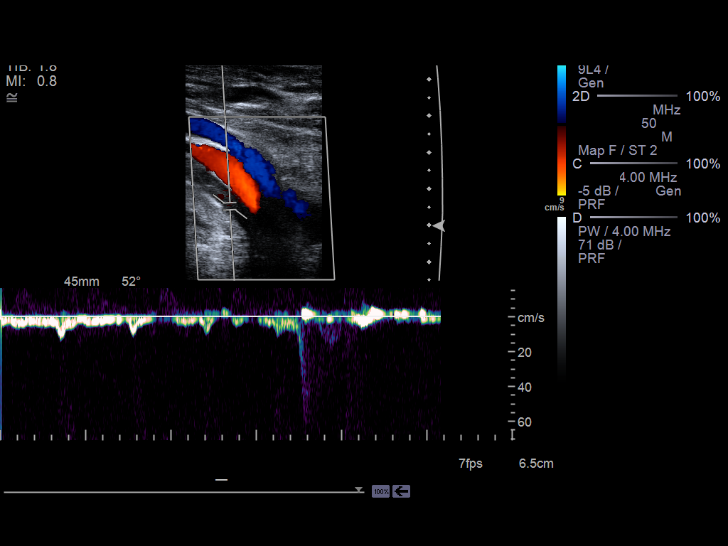
[im 29/29]
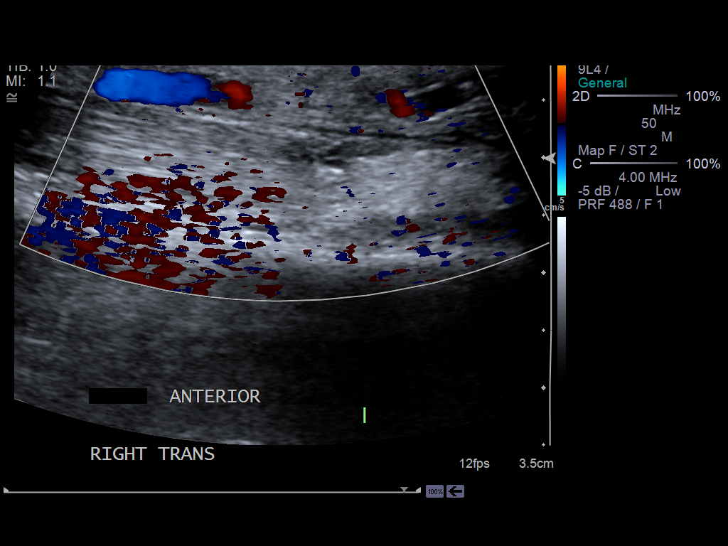

[14 of 24 positions shown; findings below may reference images not displayed]

PROCEDURE:     MCLAURIN - MCLAURIN DOPPLER VEINS RT LEG EXTR  - January 16, 2013  [DATE]

RESULT:     Grayscale and color flow Doppler techniques were employed to
evaluate the deep veins of the right lower extremity.

The right common femoral, superficial femoral, and popliteal veins are
normally compressible. The waveform patterns are normal and the color flow
images are normal. The response to the augmentation and Valsalva maneuvers
is normal.
IMPRESSION: There is no evidence of thrombus within the right femoral
or popliteal veins.

[REDACTED]

## 2013-07-27 DIAGNOSIS — F3289 Other specified depressive episodes: Secondary | ICD-10-CM | POA: Diagnosis not present

## 2013-07-27 DIAGNOSIS — D596 Hemoglobinuria due to hemolysis from other external causes: Secondary | ICD-10-CM | POA: Diagnosis not present

## 2013-07-27 DIAGNOSIS — Z1331 Encounter for screening for depression: Secondary | ICD-10-CM | POA: Diagnosis not present

## 2013-07-27 DIAGNOSIS — N401 Enlarged prostate with lower urinary tract symptoms: Secondary | ICD-10-CM | POA: Diagnosis not present

## 2013-08-14 ENCOUNTER — Ambulatory Visit (AMBULATORY_SURGERY_CENTER): Payer: BC Managed Care – PPO

## 2013-08-14 ENCOUNTER — Encounter: Payer: Self-pay | Admitting: Internal Medicine

## 2013-08-14 VITALS — Ht 68.0 in | Wt 175.0 lb

## 2013-08-14 DIAGNOSIS — Z86010 Personal history of colonic polyps: Secondary | ICD-10-CM

## 2013-08-14 MED ORDER — NA SULFATE-K SULFATE-MG SULF 17.5-3.13-1.6 GM/177ML PO SOLN: ORAL | Status: DC

## 2013-08-29 ENCOUNTER — Telehealth: Payer: Self-pay

## 2013-08-29 ENCOUNTER — Ambulatory Visit (AMBULATORY_SURGERY_CENTER): Payer: BC Managed Care – PPO | Admitting: Internal Medicine

## 2013-08-29 ENCOUNTER — Encounter: Payer: Self-pay | Admitting: Internal Medicine

## 2013-08-29 VITALS — BP 127/75 | HR 60 | Temp 97.5°F | Resp 15 | Ht 68.0 in | Wt 175.0 lb

## 2013-08-29 DIAGNOSIS — N402 Nodular prostate without lower urinary tract symptoms: Secondary | ICD-10-CM

## 2013-08-29 DIAGNOSIS — K648 Other hemorrhoids: Secondary | ICD-10-CM | POA: Diagnosis not present

## 2013-08-29 DIAGNOSIS — K5730 Diverticulosis of large intestine without perforation or abscess without bleeding: Secondary | ICD-10-CM

## 2013-08-29 DIAGNOSIS — D619 Aplastic anemia, unspecified: Secondary | ICD-10-CM | POA: Diagnosis not present

## 2013-08-29 DIAGNOSIS — Z1211 Encounter for screening for malignant neoplasm of colon: Secondary | ICD-10-CM

## 2013-08-29 DIAGNOSIS — Z86010 Personal history of colonic polyps: Secondary | ICD-10-CM | POA: Diagnosis not present

## 2013-08-29 DIAGNOSIS — J449 Chronic obstructive pulmonary disease, unspecified: Secondary | ICD-10-CM | POA: Diagnosis not present

## 2013-08-29 HISTORY — DX: Nodular prostate without lower urinary tract symptoms: N40.2

## 2013-08-29 MED ORDER — SODIUM CHLORIDE 0.9 % IV SOLN: 500.0000 mL | INTRAVENOUS | Status: DC

## 2013-08-29 NOTE — Progress Notes (Signed)
Patient did not experience any of the following events: a burn prior to discharge; a fall within the facility; wrong site/side/patient/procedure/implant event; or a hospital transfer or hospital admission upon discharge from the facility. (G8907) Patient did not have preoperative order for IV antibiotic SSI prophylaxis. (G8918)  

## 2013-08-29 NOTE — Op Note (Addendum)
[facility] [address] [place], [address]   COLONOSCOPY PROCEDURE REPORT  PATIENT: Vincent Jensen, Vincent Jensen  MR#: [id number] BIRTHDATE: July 07, 1941 , 72  yrs. old GENDER: Male ENDOSCOPIST: Iva Boop, MD, [facility] REFERRED [id number] Evlyn Kanner, M.D. PROCEDURE DATE:  08/29/2013 PROCEDURE:   Colonoscopy, screening First Screening Colonoscopy - Avg.  risk and is 50 yrs.  old or older - No.  Prior Negative Screening - Now for repeat screening. 10 or more years since last screening  History of Adenoma - Now for follow-up colonoscopy & has been > or = to 3 yrs.  N/A  Polyps Removed Today? No.  Recommend repeat exam, <10 yrs? No. ASA CLASS:   Class II INDICATIONS:average risk screening and Last colonoscopy performed 1997. MEDICATIONS: Propofol (Diprivan) 170 mg IV, MAC sedation, administered by CRNA, and These medications were titrated to patient response per physician's verbal order  DESCRIPTION OF PROCEDURE:   After the risks benefits and alternatives of the procedure were thoroughly explained, informed consent was obtained.  A digital rectal exam revealed a nodule prostate.   The LB [id number]  endoscope was introduced through the anus and advanced to the cecum, which was identified by both the appendix and ileocecal valve. No adverse events experienced.   The quality of the prep was Suprep good  The instrument was then slowly withdrawn as the colon was fully examined.   COLON FINDINGS: Severe diverticulosis was noted throughout the entire examined colon.   The colon mucosa was otherwise normal. Retroflexed views revealed internal hemorrhoids. The time to cecum=2 minutes 31 seconds.  Withdrawal time=7 minutes 52 seconds. The scope was withdrawn and the procedure completed. COMPLICATIONS: There were no complications.  ENDOSCOPIC IMPRESSION: 1.   Severe diverticulosis was noted throughout the entire examined colon 2.   The colon mucosa was otherwise normal 3.    Internal hemorrhoids 4.   Prostate nodule, inferior left lateral lobe - has seen urologist - 5 years ago - will send back  RECOMMENDATIONS: Will make urology referral to evaluate prostate nodule unless already known/done. May not need routine colonoscopy going forward given age now and 2 negative exams (1997 and today).  eSigned:  Iva Boop, MD, [facility] 08/29/2013 12:29 PM Revised: 08/29/2013 12:29 PM  cc: Adrian Prince, MD and The Patient

## 2013-08-29 NOTE — Progress Notes (Signed)
Procedure ends, to recovery, report given and VSS. 

## 2013-08-29 NOTE — Telephone Encounter (Signed)
Patient is referred to Dr. Marlou Porch at [facility] for 09/13/13 7:45 arrival for a 8:00 appt.  Patient aware of the appt date and time

## 2013-08-29 NOTE — Telephone Encounter (Signed)
Message copied by Annett Fabian on Wed Aug 29, 2013  2:10 PM ------      Message from: Stan Head E      Created: Wed Aug 29, 2013 12:31 PM      Regarding: prostate nodule - GU appt       He has a large left nodule - apparently saw someone at [facility] yrs ago      He saw one and switched to another MD "because first one said I couldn't have sex'.            He needs appt w/ second one or if that one is retired another MD there ------

## 2013-08-29 NOTE — Patient Instructions (Addendum)
No polyps or cancer seen in the colon.  I did feel a nodule on the left lobe of the prostate which, if not already done, should be evaluated by a urologist.  You also have a condition called diverticulosis - common and not usually a problem. Please read the handout provided. Internal hemorrhoids were also seen.  You may not need another routine colonoscopy - could consider another one in 10 years but would not put you on a list to call back.  I appreciate the opportunity to care for you. Iva Boop, MD, FACG     YOU HAD AN ENDOSCOPIC PROCEDURE TODAY AT THE [facility]: Refer to the procedure report that was given to you for any specific questions about what was found during the examination.  If the procedure report does not answer your questions, please call your gastroenterologist to clarify.  If you requested that your care partner not be given the details of your procedure findings, then the procedure report has been included in a sealed envelope for you to review at your convenience later.  YOU SHOULD EXPECT: Some feelings of bloating in the abdomen. Passage of more gas than usual.  Walking can help get rid of the air that was put into your GI tract during the procedure and reduce the bloating. If you had a lower endoscopy (such as a colonoscopy or flexible sigmoidoscopy) you may notice spotting of blood in your stool or on the toilet paper. If you underwent a bowel prep for your procedure, then you may not have a normal bowel movement for a few days.  DIET: Your first meal following the procedure should be a light meal and then it is ok to progress to your normal diet.  A half-sandwich or bowl of soup is an example of a good first meal.  Heavy or fried foods are harder to digest and may make you feel nauseous or bloated.  Likewise meals heavy in dairy and vegetables can cause extra gas to form and this can also increase the bloating.  Drink plenty of fluids but you should  avoid alcoholic beverages for 24 hours.  ACTIVITY: Your care partner should take you home directly after the procedure.  You should plan to take it easy, moving slowly for the rest of the day.  You can resume normal activity the day after the procedure however you should NOT DRIVE or use heavy machinery for 24 hours (because of the sedation medicines used during the test).    SYMPTOMS TO REPORT IMMEDIATELY: A gastroenterologist can be reached at any hour.  During normal business hours, 8:30 AM to 5:00 PM Monday through Friday, call [phone].  After hours and on weekends, please call the GI answering service at [phone] who will take a message and have the physician on call contact you.   Following lower endoscopy (colonoscopy or flexible sigmoidoscopy):  Excessive amounts of blood in the stool  Significant tenderness or worsening of abdominal pains  Swelling of the abdomen that is new, acute  Fever of 100F or higher   FOLLOW UP: If any biopsies were taken you will be contacted by phone or by letter within the next 1-3 weeks.  Call your gastroenterologist if you have not heard about the biopsies in 3 weeks.  Our staff will call the home number listed on your records the next business day following your procedure to check on you and address any questions or concerns that you may have at that  time regarding the information given to you following your procedure. This is a courtesy call and so if there is no answer at the home number and we have not heard from you through the emergency physician on call, we will assume that you have returned to your regular daily activities without incident.  SIGNATURES/CONFIDENTIALITY: You and/or your care partner have signed paperwork which will be entered into your electronic medical record.  These signatures attest to the fact that that the information above on your After Visit Summary has been reviewed and is understood.  Full responsibility of the  confidentiality of this discharge information lies with you and/or your care-partner.   Resume medications. Information given on hemorrhoids and diverticulosis with discharge instructions.

## 2013-08-30 ENCOUNTER — Telehealth: Payer: Self-pay | Admitting: *Deleted

## 2013-08-30 NOTE — Telephone Encounter (Signed)
  Follow up Call-  Call back number 08/29/2013  Post procedure Call Back phone  # [phone]  Permission to leave phone message Yes     Patient questions:  Do you have a fever, pain , or abdominal swelling? no Pain Score  0 *  Have you tolerated food without any problems? yes  Have you been able to return to your normal activities? yes  Do you have any questions about your discharge instructions: Diet   no Medications  no Follow up visit  no  Do you have questions or concerns about your Care? no  Actions: * If pain score is 4 or above: No action needed, pain <4.

## 2013-09-10 DIAGNOSIS — E785 Hyperlipidemia, unspecified: Secondary | ICD-10-CM | POA: Diagnosis not present

## 2013-09-10 DIAGNOSIS — D619 Aplastic anemia, unspecified: Secondary | ICD-10-CM | POA: Diagnosis not present

## 2013-09-10 DIAGNOSIS — D596 Hemoglobinuria due to hemolysis from other external causes: Secondary | ICD-10-CM | POA: Diagnosis not present

## 2013-09-10 DIAGNOSIS — N401 Enlarged prostate with lower urinary tract symptoms: Secondary | ICD-10-CM | POA: Diagnosis not present

## 2013-09-10 DIAGNOSIS — F3289 Other specified depressive episodes: Secondary | ICD-10-CM | POA: Diagnosis not present

## 2013-09-10 DIAGNOSIS — E291 Testicular hypofunction: Secondary | ICD-10-CM | POA: Diagnosis not present

## 2013-09-11 DIAGNOSIS — N402 Nodular prostate without lower urinary tract symptoms: Secondary | ICD-10-CM | POA: Diagnosis not present

## 2013-09-19 DIAGNOSIS — D619 Aplastic anemia, unspecified: Secondary | ICD-10-CM | POA: Diagnosis not present

## 2013-09-19 DIAGNOSIS — D509 Iron deficiency anemia, unspecified: Secondary | ICD-10-CM | POA: Diagnosis not present

## 2014-01-18 DIAGNOSIS — H40039 Anatomical narrow angle, unspecified eye: Secondary | ICD-10-CM | POA: Diagnosis not present

## 2014-01-18 DIAGNOSIS — H539 Unspecified visual disturbance: Secondary | ICD-10-CM | POA: Diagnosis not present

## 2014-01-18 DIAGNOSIS — H02839 Dermatochalasis of unspecified eye, unspecified eyelid: Secondary | ICD-10-CM | POA: Diagnosis not present

## 2014-01-18 DIAGNOSIS — H04129 Dry eye syndrome of unspecified lacrimal gland: Secondary | ICD-10-CM | POA: Diagnosis not present

## 2014-01-23 DIAGNOSIS — E291 Testicular hypofunction: Secondary | ICD-10-CM | POA: Diagnosis not present

## 2014-01-23 DIAGNOSIS — N138 Other obstructive and reflux uropathy: Secondary | ICD-10-CM | POA: Diagnosis not present

## 2014-01-23 DIAGNOSIS — R42 Dizziness and giddiness: Secondary | ICD-10-CM | POA: Diagnosis not present

## 2014-01-23 DIAGNOSIS — E785 Hyperlipidemia, unspecified: Secondary | ICD-10-CM | POA: Diagnosis not present

## 2014-01-23 DIAGNOSIS — N401 Enlarged prostate with lower urinary tract symptoms: Secondary | ICD-10-CM | POA: Diagnosis not present

## 2014-01-23 DIAGNOSIS — D596 Hemoglobinuria due to hemolysis from other external causes: Secondary | ICD-10-CM | POA: Diagnosis not present

## 2014-01-23 DIAGNOSIS — D619 Aplastic anemia, unspecified: Secondary | ICD-10-CM | POA: Diagnosis not present

## 2014-01-31 DIAGNOSIS — H539 Unspecified visual disturbance: Secondary | ICD-10-CM | POA: Diagnosis not present

## 2014-01-31 DIAGNOSIS — H40039 Anatomical narrow angle, unspecified eye: Secondary | ICD-10-CM | POA: Diagnosis not present

## 2014-01-31 DIAGNOSIS — H2510 Age-related nuclear cataract, unspecified eye: Secondary | ICD-10-CM | POA: Diagnosis not present

## 2014-01-31 DIAGNOSIS — H02839 Dermatochalasis of unspecified eye, unspecified eyelid: Secondary | ICD-10-CM | POA: Diagnosis not present

## 2014-02-21 DIAGNOSIS — H40039 Anatomical narrow angle, unspecified eye: Secondary | ICD-10-CM | POA: Diagnosis not present

## 2014-03-06 DIAGNOSIS — D61818 Other pancytopenia: Secondary | ICD-10-CM | POA: Diagnosis not present

## 2014-04-08 DIAGNOSIS — M79609 Pain in unspecified limb: Secondary | ICD-10-CM | POA: Diagnosis not present

## 2014-05-30 DIAGNOSIS — E785 Hyperlipidemia, unspecified: Secondary | ICD-10-CM | POA: Diagnosis not present

## 2014-05-30 DIAGNOSIS — D596 Hemoglobinuria due to hemolysis from other external causes: Secondary | ICD-10-CM | POA: Diagnosis not present

## 2014-05-30 DIAGNOSIS — R42 Dizziness and giddiness: Secondary | ICD-10-CM | POA: Diagnosis not present

## 2014-05-30 DIAGNOSIS — D619 Aplastic anemia, unspecified: Secondary | ICD-10-CM | POA: Diagnosis not present

## 2014-05-30 DIAGNOSIS — N402 Nodular prostate without lower urinary tract symptoms: Secondary | ICD-10-CM | POA: Diagnosis not present

## 2014-05-30 DIAGNOSIS — E291 Testicular hypofunction: Secondary | ICD-10-CM | POA: Diagnosis not present

## 2014-07-03 DIAGNOSIS — D596 Hemoglobinuria due to hemolysis from other external causes: Secondary | ICD-10-CM | POA: Diagnosis not present

## 2014-08-06 DIAGNOSIS — M19011 Primary osteoarthritis, right shoulder: Secondary | ICD-10-CM | POA: Diagnosis not present

## 2014-08-06 DIAGNOSIS — Z96612 Presence of left artificial shoulder joint: Secondary | ICD-10-CM | POA: Diagnosis not present

## 2014-08-29 NOTE — Pre-Procedure Instructions (Addendum)
Vincent Jensen  08/29/2014   Your procedure is scheduled on:  Thursday, December 3.               [facility] AM.  Call this number if you have problems the morning of surgery: [phone]               For any questions you may have prior to day of surgery call [phone].    Remember:   Do not eat food or drink liquids after midnight Wednesday, December 2.   Take these medicines the morning of surgery with A SIP OF WATER:  none                  May take if needed : Acetaminophen                                Stop taking vitamins and fish oil and all herbal products..   Do not wear jewelry, make-up or nail polish.  Do not wear lotions, powders, or perfumes.  Men may shave face and neck the morning of surgery.  Do not bring valuables to the hospital.             [facility] is not responsible  for any belongings or valuables.               Contacts, dentures or bridgework may not be worn into surgery.  Leave suitcase in the car. After surgery it may be brought to your room.  For patients admitted to the hospital, discharge time is determined by your  treatment team.               Patients discharged the day of surgery will not be allowed to drive home.  Name and phone number of your driver:   Special Instructions: Review  [place] - Preparing For Surgery.   Please read over the following fact sheets that you were given: Pain Booklet, Coughing and Deep Breathing and Surgical Site Infection Prevention

## 2014-08-30 ENCOUNTER — Encounter (HOSPITAL_COMMUNITY): Payer: Self-pay

## 2014-08-30 ENCOUNTER — Encounter (HOSPITAL_COMMUNITY)
Admission: RE | Admit: 2014-08-30 | Discharge: 2014-08-30 | Disposition: A | Discharge status: Home / Self Care | Payer: Medicare Other | Source: Ambulatory Visit | Attending: Orthopedic Surgery | Admitting: Orthopedic Surgery

## 2014-08-30 DIAGNOSIS — Z01812 Encounter for preprocedural laboratory examination: Secondary | ICD-10-CM | POA: Insufficient documentation

## 2014-08-30 HISTORY — DX: Personal history of other medical treatment: Z92.89

## 2014-08-30 LAB — COMPREHENSIVE METABOLIC PANEL
ALBUMIN: 3.6 g/dL (ref 3.5–5.2)
ALK PHOS: 79 U/L (ref 39–117)
ALT: 17 U/L (ref 0–53)
AST: 18 U/L (ref 0–37)
Anion gap: 10 (ref 5–15)
BUN: 13 mg/dL (ref 6–23)
CHLORIDE: 105 meq/L (ref 96–112)
CO2: 27 meq/L (ref 19–32)
Calcium: 9.5 mg/dL (ref 8.4–10.5)
Creatinine, Ser: 0.92 mg/dL (ref 0.50–1.35)
GFR calc Af Amer: >90 mL/min (ref >90)
GFR calc non Af Amer: 82 mL/min — ABNORMAL LOW (ref >90)
Glucose, Bld: 89 mg/dL (ref 70–99)
POTASSIUM: 4.4 meq/L (ref 3.7–5.3)
Sodium: 142 mEq/L (ref 137–147)
Total Bilirubin: 0.5 mg/dL (ref 0.3–1.2)
Total Protein: 6.6 g/dL (ref 6.0–8.3)

## 2014-08-30 LAB — APTT: APTT: 33 s (ref 24–37)

## 2014-08-30 LAB — CBC WITH DIFFERENTIAL/PLATELET
BASOS PCT: 0 % (ref 0–1)
Basophils Absolute: 0 10*3/uL (ref 0.0–0.1)
Eosinophils Absolute: 0.1 10*3/uL (ref 0.0–0.7)
Eosinophils Relative: 4 % (ref 0–5)
HCT: 40.6 % (ref 39.0–52.0)
HEMOGLOBIN: 13.5 g/dL (ref 13.0–17.0)
LYMPHS PCT: 30 % (ref 12–46)
Lymphs Abs: 1.1 10*3/uL (ref 0.7–4.0)
MCH: 29.2 pg (ref 26.0–34.0)
MCHC: 33.3 g/dL (ref 30.0–36.0)
MCV: 87.7 fL (ref 78.0–100.0)
MONOS PCT: 7 % (ref 3–12)
Monocytes Absolute: 0.3 10*3/uL (ref 0.1–1.0)
NEUTROS ABS: 2.1 10*3/uL (ref 1.7–7.7)
NEUTROS PCT: 59 % (ref 43–77)
Platelets: 143 10*3/uL — ABNORMAL LOW (ref 150–400)
RBC: 4.63 MIL/uL (ref 4.22–5.81)
RDW: 14.2 % (ref 11.5–15.5)
WBC: 3.7 10*3/uL — AB (ref 4.0–10.5)

## 2014-08-30 LAB — PROTIME-INR
INR: 1.03 (ref 0.00–1.49)
Prothrombin Time: 13.6 seconds (ref 11.6–15.2)

## 2014-09-11 MED ORDER — CEFAZOLIN SODIUM-DEXTROSE 2-3 GM-% IV SOLR
2.0000 g | INTRAVENOUS | Status: AC
  Administered 2014-09-12: 2 g via INTRAVENOUS
  Filled 2014-09-11: qty 50

## 2014-09-12 ENCOUNTER — Encounter (HOSPITAL_COMMUNITY)
Admission: RE | Disposition: A | Discharge status: Home / Self Care | Payer: Self-pay | Source: Ambulatory Visit | Attending: Orthopedic Surgery

## 2014-09-12 ENCOUNTER — Encounter (HOSPITAL_COMMUNITY): Payer: Self-pay | Admitting: *Deleted

## 2014-09-12 ENCOUNTER — Ambulatory Visit (HOSPITAL_COMMUNITY): Payer: Managed Care, Other (non HMO) | Admitting: Anesthesiology

## 2014-09-12 ENCOUNTER — Inpatient Hospital Stay (HOSPITAL_COMMUNITY)
Admission: RE | Admit: 2014-09-12 | Discharge: 2014-09-13 | DRG: 483 | Disposition: A | Discharge status: Home / Self Care | Payer: Managed Care, Other (non HMO) | Source: Ambulatory Visit | Attending: Orthopedic Surgery | Admitting: Orthopedic Surgery

## 2014-09-12 DIAGNOSIS — Z96653 Presence of artificial knee joint, bilateral: Secondary | ICD-10-CM | POA: Diagnosis present

## 2014-09-12 DIAGNOSIS — D649 Anemia, unspecified: Secondary | ICD-10-CM | POA: Diagnosis present

## 2014-09-12 DIAGNOSIS — M19011 Primary osteoarthritis, right shoulder: Secondary | ICD-10-CM | POA: Diagnosis not present

## 2014-09-12 DIAGNOSIS — Z87891 Personal history of nicotine dependence: Secondary | ICD-10-CM | POA: Diagnosis not present

## 2014-09-12 DIAGNOSIS — G8918 Other acute postprocedural pain: Secondary | ICD-10-CM | POA: Diagnosis not present

## 2014-09-12 DIAGNOSIS — Z96643 Presence of artificial hip joint, bilateral: Secondary | ICD-10-CM | POA: Diagnosis present

## 2014-09-12 DIAGNOSIS — J45909 Unspecified asthma, uncomplicated: Secondary | ICD-10-CM | POA: Diagnosis present

## 2014-09-12 DIAGNOSIS — Z96619 Presence of unspecified artificial shoulder joint: Secondary | ICD-10-CM

## 2014-09-12 DIAGNOSIS — M1990 Unspecified osteoarthritis, unspecified site: Secondary | ICD-10-CM | POA: Diagnosis not present

## 2014-09-12 HISTORY — PX: TOTAL SHOULDER ARTHROPLASTY: SHX126

## 2014-09-12 LAB — SURGICAL PCR SCREEN
MRSA, PCR: POSITIVE — AB
STAPHYLOCOCCUS AUREUS: POSITIVE — AB

## 2014-09-12 SURGERY — ARTHROPLASTY, SHOULDER, TOTAL
Anesthesia: General | Site: Shoulder | Laterality: Right

## 2014-09-12 MED ORDER — ONDANSETRON HCL 4 MG/2ML IJ SOLN: 4.0000 mg | Freq: Four times a day (QID) | INTRAMUSCULAR | Status: DC | PRN

## 2014-09-12 MED ORDER — BUPIVACAINE-EPINEPHRINE (PF) 0.5% -1:200000 IJ SOLN
INTRAMUSCULAR | Status: DC | PRN
  Administered 2014-09-12: 30 mL via PERINEURAL

## 2014-09-12 MED ORDER — NEOSTIGMINE METHYLSULFATE 10 MG/10ML IV SOLN
INTRAVENOUS | Status: DC | PRN
  Administered 2014-09-12: 3 mg via INTRAVENOUS

## 2014-09-12 MED ORDER — DIAZEPAM 5 MG PO TABS: 5.0000 mg | ORAL_TABLET | Freq: Four times a day (QID) | ORAL | Status: DC | PRN

## 2014-09-12 MED ORDER — NEOSTIGMINE METHYLSULFATE 10 MG/10ML IV SOLN
INTRAVENOUS | Status: AC
  Filled 2014-09-12: qty 1

## 2014-09-12 MED ORDER — KETOROLAC TROMETHAMINE 15 MG/ML IJ SOLN
15.0000 mg | Freq: Four times a day (QID) | INTRAMUSCULAR | Status: DC
  Administered 2014-09-12 – 2014-09-13 (×4): 15 mg via INTRAVENOUS
  Filled 2014-09-12 (×11): qty 1

## 2014-09-12 MED ORDER — FENTANYL CITRATE 0.05 MG/ML IJ SOLN
INTRAMUSCULAR | Status: DC | PRN
  Administered 2014-09-12: 50 ug via INTRAVENOUS

## 2014-09-12 MED ORDER — OXYCODONE-ACETAMINOPHEN 5-325 MG PO TABS
1.0000 | ORAL_TABLET | ORAL | Status: DC | PRN
  Administered 2014-09-12: 1 via ORAL
  Administered 2014-09-12 – 2014-09-13 (×3): 2 via ORAL
  Filled 2014-09-12: qty 1
  Filled 2014-09-12 (×3): qty 2

## 2014-09-12 MED ORDER — SODIUM CHLORIDE 0.9 % IR SOLN
Status: DC | PRN
  Administered 2014-09-12: 3000 mL

## 2014-09-12 MED ORDER — MIDAZOLAM HCL 2 MG/2ML IJ SOLN
INTRAMUSCULAR | Status: AC
  Filled 2014-09-12: qty 2

## 2014-09-12 MED ORDER — ACETAMINOPHEN 325 MG PO TABS: 650.0000 mg | ORAL_TABLET | Freq: Four times a day (QID) | ORAL | Status: DC | PRN

## 2014-09-12 MED ORDER — PROPOFOL 10 MG/ML IV BOLUS
INTRAVENOUS | Status: DC | PRN
  Administered 2014-09-12: 150 mg via INTRAVENOUS

## 2014-09-12 MED ORDER — DULOXETINE HCL 30 MG PO CPEP
30.0000 mg | ORAL_CAPSULE | Freq: Every evening | ORAL | Status: DC
  Administered 2014-09-12: 30 mg via ORAL
  Filled 2014-09-12 (×3): qty 1

## 2014-09-12 MED ORDER — DOCUSATE SODIUM 100 MG PO CAPS
100.0000 mg | ORAL_CAPSULE | Freq: Two times a day (BID) | ORAL | Status: DC
  Administered 2014-09-12: 100 mg via ORAL
  Filled 2014-09-12 (×3): qty 1

## 2014-09-12 MED ORDER — METOCLOPRAMIDE HCL 10 MG PO TABS: 5.0000 mg | ORAL_TABLET | Freq: Three times a day (TID) | ORAL | Status: DC | PRN

## 2014-09-12 MED ORDER — FLEET ENEMA 7-19 GM/118ML RE ENEM: 1.0000 | ENEMA | Freq: Once | RECTAL | Status: AC | PRN

## 2014-09-12 MED ORDER — MUPIROCIN 2 % EX OINT: 1.0000 "application " | TOPICAL_OINTMENT | Freq: Once | CUTANEOUS | Status: DC

## 2014-09-12 MED ORDER — ONDANSETRON HCL 4 MG PO TABS: 4.0000 mg | ORAL_TABLET | Freq: Four times a day (QID) | ORAL | Status: DC | PRN

## 2014-09-12 MED ORDER — TAMSULOSIN HCL 0.4 MG PO CAPS
0.4000 mg | ORAL_CAPSULE | Freq: Every evening | ORAL | Status: DC
  Administered 2014-09-12: 0.4 mg via ORAL
  Filled 2014-09-12 (×3): qty 1

## 2014-09-12 MED ORDER — MUPIROCIN 2 % EX OINT
TOPICAL_OINTMENT | CUTANEOUS | Status: AC
  Administered 2014-09-12: 1
  Filled 2014-09-12: qty 22

## 2014-09-12 MED ORDER — BISACODYL 5 MG PO TBEC: 5.0000 mg | DELAYED_RELEASE_TABLET | Freq: Every day | ORAL | Status: DC | PRN

## 2014-09-12 MED ORDER — GLYCOPYRROLATE 0.2 MG/ML IJ SOLN
INTRAMUSCULAR | Status: DC | PRN
  Administered 2014-09-12 (×2): 0.4 mg via INTRAVENOUS

## 2014-09-12 MED ORDER — PROPOFOL 10 MG/ML IV BOLUS
INTRAVENOUS | Status: AC
  Filled 2014-09-12: qty 20

## 2014-09-12 MED ORDER — ALUM & MAG HYDROXIDE-SIMETH 200-200-20 MG/5ML PO SUSP: 30.0000 mL | ORAL | Status: DC | PRN

## 2014-09-12 MED ORDER — ONDANSETRON HCL 4 MG/2ML IJ SOLN
INTRAMUSCULAR | Status: DC | PRN
  Administered 2014-09-12: 4 mg via INTRAVENOUS

## 2014-09-12 MED ORDER — PHENYLEPHRINE HCL 10 MG/ML IJ SOLN
10.0000 mg | INTRAMUSCULAR | Status: DC | PRN
  Administered 2014-09-12: 10 ug/min via INTRAVENOUS

## 2014-09-12 MED ORDER — SODIUM CHLORIDE 0.9 % IJ SOLN
INTRAMUSCULAR | Status: AC
  Filled 2014-09-12: qty 10

## 2014-09-12 MED ORDER — POLYETHYLENE GLYCOL 3350 17 G PO PACK: 17.0000 g | PACK | Freq: Every day | ORAL | Status: DC | PRN

## 2014-09-12 MED ORDER — GLYCOPYRROLATE 0.2 MG/ML IJ SOLN
INTRAMUSCULAR | Status: AC
  Filled 2014-09-12: qty 4

## 2014-09-12 MED ORDER — ONDANSETRON HCL 4 MG/2ML IJ SOLN
INTRAMUSCULAR | Status: AC
  Filled 2014-09-12: qty 2

## 2014-09-12 MED ORDER — MENTHOL 3 MG MT LOZG: 1.0000 | LOZENGE | OROMUCOSAL | Status: DC | PRN

## 2014-09-12 MED ORDER — MIDAZOLAM HCL 5 MG/5ML IJ SOLN
INTRAMUSCULAR | Status: DC | PRN
  Administered 2014-09-12: 2 mg via INTRAVENOUS

## 2014-09-12 MED ORDER — ACETAMINOPHEN 650 MG RE SUPP: 650.0000 mg | Freq: Four times a day (QID) | RECTAL | Status: DC | PRN

## 2014-09-12 MED ORDER — HYDROMORPHONE HCL 1 MG/ML IJ SOLN: 0.2500 mg | INTRAMUSCULAR | Status: DC | PRN

## 2014-09-12 MED ORDER — METOCLOPRAMIDE HCL 5 MG/ML IJ SOLN: 5.0000 mg | Freq: Three times a day (TID) | INTRAMUSCULAR | Status: DC | PRN

## 2014-09-12 MED ORDER — ROCURONIUM BROMIDE 100 MG/10ML IV SOLN
INTRAVENOUS | Status: DC | PRN
  Administered 2014-09-12: 50 mg via INTRAVENOUS

## 2014-09-12 MED ORDER — CHLORHEXIDINE GLUCONATE 4 % EX LIQD
60.0000 mL | Freq: Once | CUTANEOUS | Status: DC
  Filled 2014-09-12: qty 60

## 2014-09-12 MED ORDER — LIDOCAINE HCL (CARDIAC) 20 MG/ML IV SOLN
INTRAVENOUS | Status: AC
  Filled 2014-09-12: qty 5

## 2014-09-12 MED ORDER — ROCURONIUM BROMIDE 50 MG/5ML IV SOLN
INTRAVENOUS | Status: AC
  Filled 2014-09-12: qty 1

## 2014-09-12 MED ORDER — FENTANYL CITRATE 0.05 MG/ML IJ SOLN
INTRAMUSCULAR | Status: AC
  Filled 2014-09-12: qty 5

## 2014-09-12 MED ORDER — EPHEDRINE SULFATE 50 MG/ML IJ SOLN
INTRAMUSCULAR | Status: AC
  Filled 2014-09-12: qty 1

## 2014-09-12 MED ORDER — DIPHENHYDRAMINE HCL 12.5 MG/5ML PO ELIX: 12.5000 mg | ORAL_SOLUTION | ORAL | Status: DC | PRN

## 2014-09-12 MED ORDER — LIDOCAINE HCL (CARDIAC) 20 MG/ML IV SOLN
INTRAVENOUS | Status: DC | PRN
  Administered 2014-09-12: 40 mg via INTRAVENOUS

## 2014-09-12 MED ORDER — LACTATED RINGERS IV SOLN
INTRAVENOUS | Status: DC
  Administered 2014-09-12: 11:00:00 via INTRAVENOUS

## 2014-09-12 MED ORDER — EPHEDRINE SULFATE 50 MG/ML IJ SOLN
INTRAMUSCULAR | Status: DC | PRN
  Administered 2014-09-12 (×3): 10 mg via INTRAVENOUS

## 2014-09-12 MED ORDER — PHENOL 1.4 % MT LIQD: 1.0000 | OROMUCOSAL | Status: DC | PRN

## 2014-09-12 MED ORDER — KETOROLAC TROMETHAMINE 15 MG/ML IJ SOLN
INTRAMUSCULAR | Status: AC
  Filled 2014-09-12: qty 1

## 2014-09-12 MED ORDER — CEFAZOLIN SODIUM 1-5 GM-% IV SOLN
1.0000 g | Freq: Four times a day (QID) | INTRAVENOUS | Status: AC
  Administered 2014-09-12 – 2014-09-13 (×3): 1 g via INTRAVENOUS
  Filled 2014-09-12 (×3): qty 50

## 2014-09-12 MED ORDER — LACTATED RINGERS IV SOLN
INTRAVENOUS | Status: DC
  Administered 2014-09-12: 07:00:00 via INTRAVENOUS

## 2014-09-12 SURGICAL SUPPLY — 67 items
BLADE SAW SGTL 83.5X18.5 (BLADE) ×3 IMPLANT
CAPT SHLDR TOTAL 2 ×3 IMPLANT
CEMENT BONE DEPUY (Cement) ×3 IMPLANT
CLOSURE WOUND 1/2 X4 (GAUZE/BANDAGES/DRESSINGS)
COVER SURGICAL LIGHT HANDLE (MISCELLANEOUS) ×3 IMPLANT
DERMABOND ADVANCED (GAUZE/BANDAGES/DRESSINGS) ×2
DERMABOND ADVANCED .7 DNX12 (GAUZE/BANDAGES/DRESSINGS) ×1 IMPLANT
DRAPE IMP U-DRAPE 54X76 (DRAPES) ×3 IMPLANT
DRAPE INCISE IOBAN 66X45 STRL (DRAPES) ×3 IMPLANT
DRAPE ORTHO SPLIT 77X108 STRL (DRAPES) ×4
DRAPE SURG 17X11 SM STRL (DRAPES) IMPLANT
DRAPE SURG 17X23 STRL (DRAPES) ×3 IMPLANT
DRAPE SURG ORHT 6 SPLT 77X108 (DRAPES) ×2 IMPLANT
DRAPE U-SHAPE 47X51 STRL (DRAPES) ×3 IMPLANT
DRILL BIT 7/64X5 (BIT) ×3 IMPLANT
DRSG AQUACEL AG ADV 3.5X10 (GAUZE/BANDAGES/DRESSINGS) ×3 IMPLANT
DRSG MEPILEX BORDER 4X4 (GAUZE/BANDAGES/DRESSINGS) IMPLANT
DRSG MEPILEX BORDER 4X8 (GAUZE/BANDAGES/DRESSINGS) IMPLANT
DURAPREP 26ML APPLICATOR (WOUND CARE) ×6 IMPLANT
ELECT BLADE 4.0 EZ CLEAN MEGAD (MISCELLANEOUS) ×3
ELECT CAUTERY BLADE 6.4 (BLADE) ×3 IMPLANT
ELECT REM PT RETURN 9FT ADLT (ELECTROSURGICAL) ×3
ELECTRODE BLDE 4.0 EZ CLN MEGD (MISCELLANEOUS) ×1 IMPLANT
ELECTRODE REM PT RTRN 9FT ADLT (ELECTROSURGICAL) ×1 IMPLANT
FACESHIELD WRAPAROUND (MASK) ×9 IMPLANT
GLOVE BIO SURGEON STRL SZ7.5 (GLOVE) ×3 IMPLANT
GLOVE BIO SURGEON STRL SZ8 (GLOVE) ×3 IMPLANT
GLOVE EUDERMIC 7 POWDERFREE (GLOVE) ×3 IMPLANT
GLOVE SS BIOGEL STRL SZ 7.5 (GLOVE) ×1 IMPLANT
GLOVE SUPERSENSE BIOGEL SZ 7.5 (GLOVE) ×2
GOWN STRL REUS W/ TWL LRG LVL3 (GOWN DISPOSABLE) ×2 IMPLANT
GOWN STRL REUS W/ TWL XL LVL3 (GOWN DISPOSABLE) ×1 IMPLANT
GOWN STRL REUS W/TWL LRG LVL3 (GOWN DISPOSABLE) ×4
GOWN STRL REUS W/TWL XL LVL3 (GOWN DISPOSABLE) ×2
KIT BASIN OR (CUSTOM PROCEDURE TRAY) ×3 IMPLANT
KIT ROOM TURNOVER OR (KITS) ×3 IMPLANT
MANIFOLD NEPTUNE II (INSTRUMENTS) ×3 IMPLANT
NDL SUT 6 .5 CRC .975X.05 MAYO (NEEDLE) ×1 IMPLANT
NEEDLE HYPO 25GX1X1/2 BEV (NEEDLE) IMPLANT
NEEDLE MAYO TAPER (NEEDLE) ×2
NS IRRIG 1000ML POUR BTL (IV SOLUTION) ×3 IMPLANT
PACK SHOULDER (CUSTOM PROCEDURE TRAY) ×3 IMPLANT
PACK UNIVERSAL I (CUSTOM PROCEDURE TRAY) IMPLANT
PAD ARMBOARD 7.5X6 YLW CONV (MISCELLANEOUS) ×6 IMPLANT
PASSER SUT SWANSON 36MM LOOP (INSTRUMENTS) ×3 IMPLANT
PIN METAGLENE 2.5 (PIN) IMPLANT
SLING ARM FOAM STRAP LRG (SOFTGOODS) ×3 IMPLANT
SLING ARM LRG ADULT FOAM STRAP (SOFTGOODS) IMPLANT
SLING ARM XL FOAM STRAP (SOFTGOODS) IMPLANT
SMARTMIX MINI TOWER (MISCELLANEOUS) ×3
SPONGE LAP 18X18 X RAY DECT (DISPOSABLE) ×3 IMPLANT
SPONGE LAP 4X18 X RAY DECT (DISPOSABLE) ×3 IMPLANT
STRIP CLOSURE SKIN 1/2X4 (GAUZE/BANDAGES/DRESSINGS) IMPLANT
SUCTION FRAZIER TIP 10 FR DISP (SUCTIONS) ×3 IMPLANT
SUT BONE WAX W31G (SUTURE) IMPLANT
SUT FIBERWIRE #2 38 T-5 BLUE (SUTURE) ×12
SUT MNCRL AB 3-0 PS2 18 (SUTURE) ×3 IMPLANT
SUT VIC AB 1 CT1 27 (SUTURE) ×8
SUT VIC AB 1 CT1 27XBRD ANBCTR (SUTURE) ×4 IMPLANT
SUT VIC AB 2-0 CT1 27 (SUTURE) ×4
SUT VIC AB 2-0 CT1 TAPERPNT 27 (SUTURE) ×2 IMPLANT
SUTURE FIBERWR #2 38 T-5 BLUE (SUTURE) ×4 IMPLANT
SYR CONTROL 10ML LL (SYRINGE) IMPLANT
TOWEL OR 17X24 6PK STRL BLUE (TOWEL DISPOSABLE) ×3 IMPLANT
TOWEL OR 17X26 10 PK STRL BLUE (TOWEL DISPOSABLE) ×3 IMPLANT
TOWER SMARTMIX MINI (MISCELLANEOUS) ×1 IMPLANT
WATER STERILE IRR 1000ML POUR (IV SOLUTION) IMPLANT

## 2014-09-12 NOTE — Op Note (Signed)
Vincent Jensen, HEATON NO.:  [id number]  MEDICAL RECORD NO.:  [id number]  LOCATION:  5N32C                        FACILITY:  [facility]  PHYSICIAN:  Vania Rea. Antinio Sanderfer, M.D.  DATE OF BIRTH:  October 06, 1941  DATE OF PROCEDURE:  09/12/2014 DATE OF DISCHARGE:                              OPERATIVE REPORT   PREOPERATIVE DIAGNOSIS:  End-stage osteoarthritis, right shoulder.  POSTOPERATIVE DIAGNOSIS:  End-stage osteoarthritis, right shoulder.  PROCEDURE:  Right total shoulder arthroplasty utilizing a modular size 16 DePuy stem with a 48 x 18 eccentric head and a cemented pegged 44 glenoid.  SURGEON:  Vania Rea. Nykerria Macconnell, M.D.  Threasa HeadsFrench Ana A. Shuford, P.A.-C.  ANESTHESIA:  General endotracheal as well as an interscalene block.  ESTIMATED BLOOD LOSS:  200 mL.  DRAINS:  None.  HISTORY:  [name] is a 73 year old gentleman who has had chronic and progressively increasing right shoulder pain related to end-stage glenohumeral arthritis.  Due to his increasing pain and functional limitations, he is brought to the operating room at this time for planned right shoulder arthroplasty as described below.  Preoperatively, I counseled Mr. Sixtos in treatment options as well as risks versus benefits thereof.  Possible surgical complications were all reviewed including potential for bleeding, infection, neurovascular injury, persistent pain, loss of motion, anesthetic complication, failure of the implant, possible need for additional surgery.  He understands and accepts and agrees with our planned procedure.  PROCEDURE IN DETAIL:  After undergoing routine preop evaluation, the patient received prophylactic antibiotics and an interscalene block was established in the holding area by the Anesthesia Department.  Placed supine on the operating table, underwent smooth induction of a general endotracheal anesthesia.  Placed into beach-chair position and appropriately padded and  protected.  The right shoulder girdle region was sterilely prepped and draped in standard fashion.  Time-out was called.  Anterior approach to the right shoulder was made through a 12 cm incision along the deltopectoral interval.  Skin flaps were elevated. Electrocautery was used for hemostasis.  Dissection carried deeply with cephalic vein taken laterally with the deltoid.  Pectoralis major retracted medially with the __________centimeter of the tendon tenotomized to enhance exposure.  __________ deltoid, the conjoined tendon was mobilized and retracted medially.  The biceps tendon was then unroofed in the bicipital groove and tenotomized for later tenodesis. Then, we split the rotator cuff along the rotator interval from the apex of the bicipital groove to the base of the coracoid and the subscapularis was then divided away from its insertion into the lesser tuberosity.  The tuberosity leaving a cuff of tissue 1.5 cm for later repair.  We then divided the capsular attachments anteroinferiorly and inferiorly to allow complete delivery of the humeral head through the wound.  Carefully protected the rotator cuff posteriorly.  Used an extramedullary guide to outline our proposed humeral head resection, which was then performed with an oscillating saw, carefully protecting the rotator cuff.  The prominent osteophytes on the anterior and inferior aspects of the humeral head was then removed with a rongeur. Hand reaming of the humeral canal was then performed up to a size 16. We then broached up to size 16 and maintained native retroversion of  approximately 30 degrees.  Once this was completed, we placed a metal cap over the cut surface of the proximal humerus and then turned our attention to the glenoid, which was exposed with a combination of pitchfork, snake tongue and Fukuda retractors.  The subscapularis was completely mobilized and retracted anteriorly and then we used electrocautery to  excise the residual stump of the biceps proximally as well as circumferentially, performed a labral resection gaining complete visualization of the glenoid.  There were some mild retroversion, which we corrected.  As our guide pin was then placed into the center of the glenoid, we reamed to a stable solid subchondral bony bed using the 44 reamer and then a rongeur to remove peripheral osteophytes superiorly and inferiorly.  __________ holes were then placed followed by the peripheral PEG holes and the trial glenoid showed excellent fit and fixation.  The tissues were irrigated, hemostasis was obtained.  Cement was mixed in the appropriate consistency, introduced into the peripheral PEG holes.  The glenoid was impacted and cement was allowed to harden with excellent fixation.  We returned our attention to the proximal humerus where our final 16 modular stem was seated to the appropriate level at native retroversion 30 degrees.  We then performed trial reductions and 48 x 18 eccentric head showed excellent coverage of the proximal humerus, good soft tissue balance, excellent stability, and 50% translation on the glenoid.  The final 44 x 18 head was then impacted into position after cleaning the [facility] taper.  Final reduction was performed.  The wound was irrigated and hemostasis was obtained.  The subscapularis was then repaired back to the lesser tuberosity with #2 FiberWire.  The rotator interval was closed with a series of 3 figure-of- eight #2 FiberWire sutures.  The biceps tendon was then tenodesed at the upper level of pectoralis major with #2 FiberWire.  The deltopectoral interval was then reapproximated with a series of figure-of-eight #1 Vicryl sutures.  The 2-0 Vicryl used for subcu layer, intracuticular Monocryl for the skin followed by Dermabond and Aquacel dressing.  Right arm was placed in the sling.  The patient was awakened, extubated, and taken to recovery room in stable  condition.  Ralene Bathe, PA-C was used as an Geophysicist/field seismologist throughout this case essential for help with positioning the patient, positioning the extremity, management of retractors, tissue manipulation, implantation of the prosthesis, wound closure, and intraoperative decision making.     Vania Rea. Rihanna Marseille, M.D.     KMS/MEDQ  D:  09/12/2014  T:  09/12/2014  Job:  [id number]

## 2014-09-12 NOTE — Discharge Instructions (Signed)
° °  Kevin M. Supple, M.D., F.A.A.O.S. °Orthopaedic Surgery °Specializing in Arthroscopic and Reconstructive °Surgery of the Shoulder and Knee °[phone] °[address] - [place], [place] [address] - Fax [phone] ° ° °POST-OP TOTAL SHOULDER REPLACEMENT/SHOULDER HEMIARTHROPLASTY INSTRUCTIONS ° °1. Call the office at [phone] to schedule your first post-op appointment 10-14 days from the date of your surgery. ° °2. The bandage over your incision is waterproof. You may begin showering with this dressing on. You may leave this dressing on until first follow up appointment within 2 weeks. If you would like to remove it you may do so after the 5th day. Go slow and tug at the borders gently to break the bond the dressing has with the skin. The steri strips may come off with the dressing. At this point if there is no drainage it is okay to go without a bandage or you may cover it with a light guaze and tape. Leave the steri-strips in place over your incision. You can expect drainage that is bloody or yellow in nature that should gradually decrease from day of surgery. Change your dressing daily until drainage is completely resolved, then you may feel free to go without a bandage. You can also expect significant bruising around your shoulder that will drift down your arm and into your chest wall. This is very normal and should resolve over several days. ° ° 3. Wear your sling/immobilizer at all times except to perform the exercises below or to occasionally let your arm dangle by your side to stretch your elbow. You also need to sleep in your sling immobilizer until instructed otherwise. ° °4. Range of motion to your elbow, wrist, and hand are encouraged 3-5 times daily. Exercise to your hand and fingers helps to reduce swelling you may experience. ° °5. Utilize ice to the shoulder 3-5 times minimum a day and additionally if you are experiencing pain. ° °6. Prescriptions for a pain medication and a muscle  relaxant are provided for you. It is recommended that if you are experiencing pain that you pain medication alone is not controlling, add the muscle relaxant along with the pain medication which can give additional pain relief. The first 1-2 days is generally the most severe of your pain and then should gradually decrease. As your pain lessens it is recommended that you decrease your use of the pain medications to an "as needed basis'" only and to always comply with the recommended dosages of the pain medications. ° °7. Pain medications can produce constipation along with their use. If you experience this, the use of an over the counter stool softener or laxative daily is recommended.  ° °8. For most patients, if insurance allows, home health services to include therapy has been arranged. ° °9. For additional questions or concerns, please do not hesitate to call the office. If after hours there is an answering service to forward your concerns to the physician on call. ° °POST-OP EXERCISES ° °Pendulum Exercises ° °Perform pendulum exercises while standing and bending at the waist. Support your uninvolved arm on a table or chair and allow your operated arm to hang freely. Make sure to do these exercises passively - not using you shoulder muscles. ° °Repeat 20 times. Do 3 sessions per day. ° ° ° ° °

## 2014-09-12 NOTE — Anesthesia Procedure Notes (Addendum)
Anesthesia Regional Block:  Interscalene brachial plexus block  Pre-Anesthetic Checklist: ,, timeout performed, Correct Patient, Correct Site, Correct Laterality, Correct Procedure, Correct Position, site marked, Risks and benefits discussed,  Surgical consent,  Pre-op evaluation,  At surgeon's request and post-op pain management  Laterality: Upper and Right  Prep: chloraprep and alcohol swabs       Needles:  Injection technique: Single-shot  Needle Type: Echogenic Needle     Needle Length: 9cm 9 cm Needle Gauge: 22 and 22 G  Needle insertion depth: 5 cm   Additional Needles:  Procedures: ultrasound guided (picture in chart) and nerve stimulator Interscalene brachial plexus block  Nerve Stimulator or Paresthesia:  Response: Twitch elicited, 0.5 mA, 0.3 ms,   Additional Responses:   Narrative:  Start time: 09/12/2014 7:05 AM End time: 09/12/2014 7:20 AM Injection made incrementally with aspirations every 5 mL.  Performed by: Personally  Anesthesiologist: MASSAGEE, TERRY  Additional Notes: Block assessed prior to start of surgery.Tolerated well   Procedure Name: Intubation Date/Time: 09/12/2014 8:12 AM Performed by: Sharlene DoryWALKER, Dyllin Gulley E Pre-anesthesia Checklist: Patient identified, Emergency Drugs available, Suction available, Patient being monitored and Timeout performed Patient Re-evaluated:Patient Re-evaluated prior to inductionOxygen Delivery Method: Circle system utilized Preoxygenation: Pre-oxygenation with 100% oxygen Intubation Type: IV induction Ventilation: Mask ventilation without difficulty Laryngoscope Size: Mac and 4 Grade View: Grade III Tube type: Oral Tube size: 7.5 mm Number of attempts: 1 Airway Equipment and Method: Stylet Placement Confirmation: positive ETCO2 and breath sounds checked- equal and bilateral Secured at: 22 cm Tube secured with: Tape Dental Injury: Teeth and Oropharynx as per pre-operative assessment

## 2014-09-12 NOTE — Anesthesia Preprocedure Evaluation (Addendum)
Anesthesia Evaluation  Patient identified by MRN, date of birth, ID band Patient awake    Reviewed: Allergy & Precautions, H&P , NPO status   Airway Mallampati: I   Neck ROM: Full    Dental  (+) Teeth Intact, Caps   Pulmonary asthma , former smoker,  breath sounds clear to auscultation        Cardiovascular negative cardio ROS  Rhythm:Regular Rate:Normal     Neuro/Psych    GI/Hepatic negative GI ROS, Neg liver ROS,   Endo/Other  negative endocrine ROS  Renal/GU negative Renal ROS     Musculoskeletal  (+) Arthritis -,   Abdominal   Peds  Hematology negative hematology ROS (+) anemia ,   Anesthesia Other Findings   Reproductive/Obstetrics                            Anesthesia Physical Anesthesia Plan  ASA: II  Anesthesia Plan: General   Post-op Pain Management:    Induction: Intravenous  Airway Management Planned: Oral ETT  Additional Equipment:   Intra-op Plan:   Post-operative Plan: Extubation in OR  Informed Consent: I have reviewed the patients History and Physical, chart, labs and discussed the procedure including the risks, benefits and alternatives for the proposed anesthesia with the patient or authorized representative who has indicated his/her understanding and acceptance.   Dental advisory given  Plan Discussed with: CRNA and Surgeon  Anesthesia Plan Comments:         Anesthesia Quick Evaluation

## 2014-09-12 NOTE — Op Note (Signed)
09/12/2014  10:16 AM  PATIENT:   Baraka Garland  73 y.o. male  PRE-OPERATIVE DIAGNOSIS:  OA RIGHT SHOULDER   POST-OPERATIVE DIAGNOSIS:  same  PROCEDURE:  R TSA #16 stem 48x18 eccentric head, 44 glenoid  SURGEON:  Denitra Donaghey, Vania ReaKevin M. M.D.  ASSISTANTS: Shuford pac   ANESTHESIA:   GET + ISB  EBL: 200  SPECIMEN:  none  Drains: none   PATIENT DISPOSITION:  PACU - hemodynamically stable.    PLAN OF CARE: Admit to inpatient   Dictation# [id number]

## 2014-09-12 NOTE — Progress Notes (Signed)
Utilization review completed.  

## 2014-09-12 NOTE — Transfer of Care (Signed)
Immediate Anesthesia Transfer of Care Note  Patient: Vincent Jensen  Procedure(s) Performed: Procedure(s): RIGHT TOTAL SHOULDER ARTHROPLASTY (Right)  Patient Location: PACU  Anesthesia Type:General  Level of Consciousness: awake, alert  and oriented  Airway & Oxygen Therapy: Patient Spontanous Breathing and Patient connected to nasal cannula oxygen  Post-op Assessment: Report given to PACU RN, Post -op Vital signs reviewed and stable and Patient moving all extremities  Post vital signs: Reviewed and stable  Complications: No apparent anesthesia complications

## 2014-09-12 NOTE — H&P (Signed)
Vincent Jensen    Chief Complaint: OA RIGHT SHOULDER  HPI: The patient is a 73 y.o. male with end stage right shoulder OA  Past Medical History  Diagnosis Date  . Aplastic anemia   . PNH (paroxysmal nocturnal hemoglobinuria)   . Diverticulosis   . Arthritis   . Prostate nodule - left lobe 08/29/2013    DRE 08/29/2013   . History of blood transfusion     Past Surgical History  Procedure Laterality Date  . Joint replacement    . Appendectomy    . Shoulder arthroscopy Left     left shoulder /replacement surg  . Knee surgery Bilateral     knee replacement surg/Bil  . Total hip arthroplasty Bilateral     Bil  . Tonsillectomy    . Colonoscopy      Family History  Problem Relation Age of Onset  . Myasthenia gravis Mother   . Cancer Father     Social History:  reports that he quit smoking about 42 years ago. He has never used smokeless tobacco. He reports that he does not drink alcohol or use illicit drugs.  Allergies: No Known Allergies  Medications Prior to Admission  Medication Sig Dispense Refill  . Ascorbic Acid (VITAMIN C PO) Take 1 tablet by mouth every other day. Alternate with vitamin b complex    . B Complex-C (B-COMPLEX WITH VITAMIN C) tablet Take 1 tablet by mouth every other day. Alternate with vitamin c    . DULoxetine (CYMBALTA) 30 MG capsule Take 30 mg by mouth every evening.     . Multiple Vitamin (MULITIVITAMIN WITH MINERALS) TABS Take 1 tablet by mouth daily.    . Red Yeast Rice Extract (RED YEAST RICE PO) Take 1 tablet by mouth 2 (two) times daily.    Marland Kitchen. acetaminophen (TYLENOL) 500 MG tablet Take 500 mg by mouth every 6 (six) hours as needed (pain).     . nitroGLYCERIN (NITROSTAT) 0.4 MG SL tablet Place 1 tablet (0.4 mg total) under the tongue every 5 (five) minutes x 3 doses as needed for chest pain. (Patient not taking: Reported on 08/30/2014) 15 tablet 3  . oxymetazoline (AFRIN) 0.05 % nasal spray Place 2 sprays into the nose at bedtime as needed for  congestion.     . tamsulosin (FLOMAX) 0.4 MG CAPS capsule Take 0.4 mg by mouth every evening.     . traMADol (ULTRAM) 50 MG tablet Take 2 tablets (100 mg total) by mouth every 6 (six) hours as needed for pain. Normal dose is bedtime as needed for pain (Patient not taking: Reported on 08/30/2014) 80 tablet 0     Physical Exam: rogth shoulder with painful and restricted motion as noted at recent office visits  Vitals  Temp:  [98 F (36.7 C)] 98 F (36.7 C) (12/03 16100613) Pulse Rate:  [91] 91 (12/03 0613) Resp:  [18] 18 (12/03 0613) BP: (117)/(61) 117/61 mmHg (12/03 0613) SpO2:  [98 %] 98 % (12/03 0613) Weight:  [78.926 kg (174 lb)] 78.926 kg (174 lb) (12/03 [id number])  Assessment/Plan  Impression: OA RIGHT SHOULDER   Plan of Action: Procedure(s): RIGHT TOTAL SHOULDER ARTHROPLASTY  Chantalle Defilippo M 09/12/2014, 8:06 AM

## 2014-09-12 NOTE — Anesthesia Postprocedure Evaluation (Signed)
  Anesthesia Post-op Note  Patient: Vincent Jensen  Procedure(s) Performed: Procedure(s): RIGHT TOTAL SHOULDER ARTHROPLASTY (Right)  Patient Location: PACU  Anesthesia Type:GA combined with regional for post-op pain  Level of Consciousness: awake, alert , oriented and patient cooperative  Airway and Oxygen Therapy: Patient Spontanous Breathing  Post-op Pain: none  Post-op Assessment: Post-op Vital signs reviewed, Patient's Cardiovascular Status Stable, Respiratory Function Stable, Patent Airway, No signs of Nausea or vomiting and Pain level controlled  Post-op Vital Signs: stable  Last Vitals:  Filed Vitals:   09/12/14 1100  BP: 98/61  Pulse: 72  Temp:   Resp: 20    Complications: No apparent anesthesia complications

## 2014-09-13 ENCOUNTER — Encounter (HOSPITAL_COMMUNITY): Payer: Self-pay | Admitting: Orthopedic Surgery

## 2014-09-13 MED ORDER — DIAZEPAM 5 MG PO TABS: 2.5000 mg | ORAL_TABLET | Freq: Four times a day (QID) | ORAL | Status: DC | PRN

## 2014-09-13 MED ORDER — OXYCODONE-ACETAMINOPHEN 5-325 MG PO TABS: 1.0000 | ORAL_TABLET | ORAL | Status: DC | PRN

## 2014-09-13 NOTE — Progress Notes (Addendum)
Occupational Therapy Evaluation and Discharge Patient Details Name: Vincent Jensen MRN: [id number] DOB: May 20, 1941 Today's Date: 09/13/2014    History of Present Illness 73 y.o. Male s/p Right TSA on 09/12/14. PMH: arthritis, diverticulosis, aplastic anemia, Left TSA, Bil TKA, Bil THA.    Clinical Impression   PTA pt lived at home alone and was independent with ADLs. He is currently at Mod I level and familiar with compensatory techniques from previous TSA, however may need assistance opening containers and cutting tough food. Pt plans to d/c home today and all education and training completed for therapeutic exercises. Acute OT to sign off.     Follow Up Recommendations  No OT follow up;Supervision - Intermittent    Equipment Recommendations  None recommended by OT    Recommendations for Other Services       Precautions / Restrictions Precautions Precautions: Shoulder Type of Shoulder Precautions: Supple TSA Protocol (30*ER, 60*ABD, 90* FF PROM), pendulums, and elbow/wrist/hand.  Shoulder Interventions: Shoulder sling/immobilizer;For comfort (and sleep) Precaution Booklet Issued: Yes (comment) Precaution Comments: Educated pt on shoulder precautions and incorporating into ADLs.  Required Braces or Orthoses: Sling Restrictions Weight Bearing Restrictions: Yes RUE Weight Bearing: Non weight bearing      Mobility Bed Mobility Overal bed mobility: Independent                Transfers Overall transfer level: Independent                         ADL Overall ADL's : Needs assistance/impaired Eating/Feeding: Set up;Sitting Eating/Feeding Details (indicate cue type and reason): pt will require assist to open containers and cut food                                   General ADL Comments: Pt Mod I for all other ADLs. He has hx of TSA on his Left side and is familiar with compensatory techniques. No balance issues and moving around room easily.       Vision  Pt reports no change from baseline.                    Perception Perception Perception Tested?: No   Praxis Praxis Praxis tested?: Within functional limits    Pertinent Vitals/Pain Pain Assessment: 0-10 Pain Score: 4  Pain Location: Rt shoulder Pain Descriptors / Indicators: Aching Pain Intervention(s): Monitored during session;Repositioned     Hand Dominance Right   Extremity/Trunk Assessment Upper Extremity Assessment Upper Extremity Assessment: RUE deficits/detail RUE Deficits / Details: R TSA (Supple Protocol). Pt able to achieve ~80* FF, 30* ABD, and "neutral" ER RUE: Unable to fully assess due to immobilization RUE Coordination: decreased gross motor   Lower Extremity Assessment Lower Extremity Assessment: Overall WFL for tasks assessed   Cervical / Trunk Assessment Cervical / Trunk Assessment: Normal   Communication Communication Communication: No difficulties   Cognition Arousal/Alertness: Awake/alert Behavior During Therapy: WFL for tasks assessed/performed Overall Cognitive Status: Within Functional Limits for tasks assessed                     General Comments       Exercises Exercises: Shoulder   09/13/14 0900  Shoulder Instructions  Donning/doffing shirt without moving shoulder Modified independent  Method for sponge bathing under operated UE Modified independent  Donning/doffing sling/immobilizer Modified independent  Correct positioning of sling/immobilizer Modified independent  Pendulum  exercises (written home exercise program) Modified independent  ROM for elbow, wrist and digits of operated UE Modified independent  Sling wearing schedule (on at all times/off for ADL's) Modified independent  Proper positioning of operated UE when showering Modified independent  Positioning of UE while sleeping Modified independent  Shoulder Exercises  Pendulum Exercise 10 reps;Standing  Shoulder Flexion PROM;Right;10 reps;Supine   Shoulder Extension PROM;Right;10 reps;Supine  Shoulder ABduction PROM;Right;10 reps;Supine  Shoulder External Rotation PROM;Right;10 reps;Supine  Elbow Flexion AROM;Right;10 reps;Seated  Elbow Extension AROM;Right;10 reps;Seated  Wrist Flexion AROM;Right;10 reps;Seated  Wrist Extension AROM;Right;10 reps;Seated  Digit Composite Flexion AROM;Right;10 reps;Seated  Composite Extension AROM;Right;10 reps;Seated             Home Living Family/patient expects to be discharged to:: Private residence Living Arrangements: Alone Available Help at Discharge: Available PRN/intermittently (Significant Other) Type of Home: House                                  Prior Functioning/Environment Level of Independence: Independent        Comments: Pt is fit and enjoys playing soccer.     OT Diagnosis: Acute pain    End of Session Equipment Utilized During Treatment: Other (comment) (sling) Nurse Communication: Other (comment) (pt ready for d/c from OT standpoint)  Activity Tolerance: Patient tolerated treatment well Patient left: Other (comment) (ambulating in room with visitor)   Time: [phone] OT Time Calculation (min): 33 min Charges:  OT General Charges $OT Visit: 1 Procedure OT Evaluation $Initial OT Evaluation Tier I: 1 Procedure OT Treatments $Self Care/Home Management : 8-22 mins $Therapeutic Exercise: 8-22 mins  Rae Lips 09/13/2014, 10:34 AM   Carney Living, OTR/L Occupational Therapist [phone] (pager)  Addendum: corrected charges LM

## 2014-09-13 NOTE — Discharge Summary (Signed)
PATIENT ID:      Vincent Jensen  MRN:     [id number] DOB/AGE:    [birth year] / 73 y.o.     DISCHARGE SUMMARY  ADMISSION DATE:    09/12/2014 DISCHARGE DATE:    ADMISSION DIAGNOSIS: OA RIGHT SHOULDER  Past Medical History  Diagnosis Date  . Aplastic anemia   . PNH (paroxysmal nocturnal hemoglobinuria)   . Diverticulosis   . Arthritis   . Prostate nodule - left lobe 08/29/2013    DRE 08/29/2013   . History of blood transfusion     DISCHARGE DIAGNOSIS:   Active Problems:   S/P shoulder replacement   PROCEDURE: Procedure(s): RIGHT TOTAL SHOULDER ARTHROPLASTY on 09/12/2014  CONSULTS:   none  HISTORY:  See H&P in chart.  HOSPITAL COURSE:  Vincent Jensen is a 73 y.o. admitted on 09/12/2014 with a chief complaint of right shoulder pain and dysfunction, and found to have a diagnosis of OA RIGHT SHOULDER .  They were brought to the operating room on 09/12/2014 and underwent Procedure(s): RIGHT TOTAL SHOULDER ARTHROPLASTY.    They were given perioperative antibiotics: Anti-infectives    Start     Dose/Rate Route Frequency Ordered Stop   09/12/14 1430  ceFAZolin (ANCEF) IVPB 1 g/50 mL premix     1 g100 mL/hr over 30 Minutes Intravenous Every 6 hours 09/12/14 1357 09/13/14 0352   09/12/14 0600  ceFAZolin (ANCEF) IVPB 2 g/50 mL premix     2 g100 mL/hr over 30 Minutes Intravenous On call to O.R. 09/11/14 1534 09/12/14 0830    .  Patient underwent the above named procedure and tolerated it well. The following day they were hemodynamically stable and pain was controlled on oral analgesics. They were neurovascularly intact to the operative extremity. OT was ordered and worked with patient per protocol. They were medically and orthopaedically stable for discharge on day 1.    DIAGNOSTIC STUDIES:  RECENT RADIOGRAPHIC STUDIES :  No results found.  RECENT VITAL SIGNS:  Patient Vitals for the past 24 hrs:  BP Temp Pulse Resp SpO2  09/13/14 0615 (!) 100/56 mmHg 98.2 F (36.8 C) (!) 59 16  97 %  09/13/14 0213 (!) 101/53 mmHg 99 F (37.2 C) 80 18 92 %  09/12/14 2300 (!) 104/58 mmHg 98.9 F (37.2 C) 72 16 95 %  09/12/14 1500 (!) 96/58 mmHg 97 F (36.1 C) 75 16 98 %  09/12/14 1340 - 98.1 F (36.7 C) 68 14 95 %  09/12/14 1315 - - 61 12 99 %  09/12/14 1312 - 97.6 F (36.4 C) (!) 38 (!) 21 100 %  09/12/14 1300 (!) 104/54 mmHg - 62 16 100 %  09/12/14 1230 (!) 95/58 mmHg - (!) 59 17 99 %  09/12/14 1200 (!) 101/58 mmHg - (!) 57 19 99 %  09/12/14 1145 96/62 mmHg - 76 19 98 %  09/12/14 1130 (!) 99/58 mmHg - 61 17 98 %  09/12/14 1115 (!) 99/50 mmHg - 63 20 99 %  09/12/14 1100 98/61 mmHg - 72 20 99 %  09/12/14 1045 (!) 95/51 mmHg - 72 20 99 %  09/12/14 1030 (!) 118/92 mmHg - 68 18 100 %  09/12/14 1028 - 97.9 F (36.6 C) - - -  .  RECENT EKG RESULTS:    Orders placed or performed during the hospital encounter of 12/07/11  . EKG    DISCHARGE INSTRUCTIONS:    DISCHARGE MEDICATIONS:     Medication List    STOP taking  these medications        traMADol 50 MG tablet  Commonly known as:  ULTRAM      TAKE these medications        acetaminophen 500 MG tablet  Commonly known as:  TYLENOL  Take 500 mg by mouth every 6 (six) hours as needed (pain).     B-complex with vitamin C tablet  Take 1 tablet by mouth every other day. Alternate with vitamin c     diazepam 5 MG tablet  Commonly known as:  VALIUM  Take 0.5-1 tablets (2.5-5 mg total) by mouth every 6 (six) hours as needed for muscle spasms or sedation.     DULoxetine 30 MG capsule  Commonly known as:  CYMBALTA  Take 30 mg by mouth every evening.     multivitamin with minerals Tabs tablet  Take 1 tablet by mouth daily.     oxyCODONE-acetaminophen 5-325 MG per tablet  Commonly known as:  PERCOCET  Take 1-2 tablets by mouth every 4 (four) hours as needed.     oxymetazoline 0.05 % nasal spray  Commonly known as:  AFRIN  Place 2 sprays into the nose at bedtime as needed for congestion.     RED YEAST RICE PO   Take 1 tablet by mouth 2 (two) times daily.     tamsulosin 0.4 MG Caps capsule  Commonly known as:  FLOMAX  Take 0.4 mg by mouth every evening.     VITAMIN C PO  Take 1 tablet by mouth every other day. Alternate with vitamin b complex      ASK your doctor about these medications        nitroGLYCERIN 0.4 MG SL tablet  Commonly known as:  NITROSTAT  Place 1 tablet (0.4 mg total) under the tongue every 5 (five) minutes x 3 doses as needed for chest pain.        FOLLOW UP VISIT:       Follow-up Information    Follow up with Senaida Lange, MD.   Specialty:  Orthopedic Surgery   Why:  call to be seen in 10-14 days   Contact information:   [address] [place] [address] [phone]       DISCHARGE TO: Home  DISPOSITION: Good  DISCHARGE CONDITION:  Vincent Jensen for Dr. Caryn Bee Supple 09/13/2014, 8:16 AM

## 2014-09-25 DIAGNOSIS — Z96611 Presence of right artificial shoulder joint: Secondary | ICD-10-CM | POA: Diagnosis not present

## 2014-09-25 DIAGNOSIS — Z471 Aftercare following joint replacement surgery: Secondary | ICD-10-CM | POA: Diagnosis not present

## 2014-09-27 ENCOUNTER — Ambulatory Visit: Payer: Managed Care, Other (non HMO) | Attending: Orthopedic Surgery | Admitting: Physical Therapy

## 2014-09-27 DIAGNOSIS — M25511 Pain in right shoulder: Secondary | ICD-10-CM | POA: Diagnosis not present

## 2014-10-02 ENCOUNTER — Ambulatory Visit: Payer: Managed Care, Other (non HMO) | Admitting: Physical Therapy

## 2014-10-02 DIAGNOSIS — M25511 Pain in right shoulder: Secondary | ICD-10-CM | POA: Diagnosis not present

## 2014-10-09 ENCOUNTER — Ambulatory Visit: Payer: Managed Care, Other (non HMO) | Admitting: Physical Therapy

## 2014-10-09 DIAGNOSIS — M25511 Pain in right shoulder: Secondary | ICD-10-CM | POA: Diagnosis not present

## 2014-10-14 ENCOUNTER — Ambulatory Visit: Payer: Managed Care, Other (non HMO) | Admitting: Physical Therapy

## 2014-10-15 ENCOUNTER — Ambulatory Visit: Payer: Managed Care, Other (non HMO) | Attending: Orthopedic Surgery | Admitting: Physical Therapy

## 2014-10-15 DIAGNOSIS — M25511 Pain in right shoulder: Secondary | ICD-10-CM | POA: Diagnosis not present

## 2014-10-18 ENCOUNTER — Ambulatory Visit: Payer: Managed Care, Other (non HMO) | Admitting: Physical Therapy

## 2014-10-18 DIAGNOSIS — M25511 Pain in right shoulder: Secondary | ICD-10-CM | POA: Diagnosis not present

## 2014-10-22 ENCOUNTER — Ambulatory Visit: Payer: Managed Care, Other (non HMO) | Admitting: Physical Therapy

## 2014-10-22 DIAGNOSIS — M25511 Pain in right shoulder: Secondary | ICD-10-CM | POA: Diagnosis not present

## 2014-10-29 ENCOUNTER — Ambulatory Visit: Payer: Managed Care, Other (non HMO) | Admitting: Physical Therapy

## 2014-10-29 DIAGNOSIS — M25511 Pain in right shoulder: Secondary | ICD-10-CM | POA: Diagnosis not present

## 2014-11-01 ENCOUNTER — Ambulatory Visit: Payer: Managed Care, Other (non HMO) | Admitting: Physical Therapy

## 2014-12-03 DIAGNOSIS — Z471 Aftercare following joint replacement surgery: Secondary | ICD-10-CM | POA: Diagnosis not present

## 2014-12-03 DIAGNOSIS — Z96611 Presence of right artificial shoulder joint: Secondary | ICD-10-CM | POA: Diagnosis not present

## 2015-01-01 DIAGNOSIS — D6109 Other constitutional aplastic anemia: Secondary | ICD-10-CM | POA: Diagnosis not present

## 2015-01-15 DIAGNOSIS — N401 Enlarged prostate with lower urinary tract symptoms: Secondary | ICD-10-CM | POA: Diagnosis not present

## 2015-01-15 DIAGNOSIS — Z6827 Body mass index (BMI) 27.0-27.9, adult: Secondary | ICD-10-CM | POA: Diagnosis not present

## 2015-01-15 DIAGNOSIS — M25571 Pain in right ankle and joints of right foot: Secondary | ICD-10-CM | POA: Diagnosis not present

## 2015-01-15 DIAGNOSIS — D619 Aplastic anemia, unspecified: Secondary | ICD-10-CM | POA: Diagnosis not present

## 2015-03-21 DIAGNOSIS — H40033 Anatomical narrow angle, bilateral: Secondary | ICD-10-CM | POA: Diagnosis not present

## 2015-03-21 DIAGNOSIS — H2513 Age-related nuclear cataract, bilateral: Secondary | ICD-10-CM | POA: Diagnosis not present

## 2015-03-21 DIAGNOSIS — H539 Unspecified visual disturbance: Secondary | ICD-10-CM | POA: Diagnosis not present

## 2015-07-02 DIAGNOSIS — D6109 Other constitutional aplastic anemia: Secondary | ICD-10-CM | POA: Diagnosis not present

## 2015-07-14 DIAGNOSIS — N402 Nodular prostate without lower urinary tract symptoms: Secondary | ICD-10-CM | POA: Diagnosis not present

## 2015-07-14 DIAGNOSIS — D595 Paroxysmal nocturnal hemoglobinuria [Marchiafava-Micheli]: Secondary | ICD-10-CM | POA: Diagnosis not present

## 2015-07-14 DIAGNOSIS — E785 Hyperlipidemia, unspecified: Secondary | ICD-10-CM | POA: Diagnosis not present

## 2015-07-14 DIAGNOSIS — Z6826 Body mass index (BMI) 26.0-26.9, adult: Secondary | ICD-10-CM | POA: Diagnosis not present

## 2015-07-14 DIAGNOSIS — R5381 Other malaise: Secondary | ICD-10-CM | POA: Diagnosis not present

## 2015-07-14 DIAGNOSIS — F3289 Other specified depressive episodes: Secondary | ICD-10-CM | POA: Diagnosis not present

## 2015-07-14 DIAGNOSIS — N401 Enlarged prostate with lower urinary tract symptoms: Secondary | ICD-10-CM | POA: Diagnosis not present

## 2015-07-14 DIAGNOSIS — D619 Aplastic anemia, unspecified: Secondary | ICD-10-CM | POA: Diagnosis not present

## 2015-07-14 DIAGNOSIS — E291 Testicular hypofunction: Secondary | ICD-10-CM | POA: Diagnosis not present

## 2015-12-04 DIAGNOSIS — E785 Hyperlipidemia, unspecified: Secondary | ICD-10-CM | POA: Diagnosis not present

## 2015-12-04 DIAGNOSIS — R51 Headache: Secondary | ICD-10-CM | POA: Diagnosis not present

## 2015-12-04 DIAGNOSIS — N402 Nodular prostate without lower urinary tract symptoms: Secondary | ICD-10-CM | POA: Diagnosis not present

## 2015-12-04 DIAGNOSIS — Z6826 Body mass index (BMI) 26.0-26.9, adult: Secondary | ICD-10-CM | POA: Diagnosis not present

## 2015-12-04 DIAGNOSIS — D619 Aplastic anemia, unspecified: Secondary | ICD-10-CM | POA: Diagnosis not present

## 2015-12-04 DIAGNOSIS — R0789 Other chest pain: Secondary | ICD-10-CM | POA: Diagnosis not present

## 2015-12-04 DIAGNOSIS — E291 Testicular hypofunction: Secondary | ICD-10-CM | POA: Diagnosis not present

## 2015-12-04 DIAGNOSIS — D595 Paroxysmal nocturnal hemoglobinuria [Marchiafava-Micheli]: Secondary | ICD-10-CM | POA: Diagnosis not present

## 2015-12-05 ENCOUNTER — Other Ambulatory Visit: Payer: Self-pay | Admitting: Endocrinology

## 2015-12-05 DIAGNOSIS — R519 Headache, unspecified: Secondary | ICD-10-CM

## 2015-12-05 DIAGNOSIS — R51 Headache: Principal | ICD-10-CM

## 2015-12-10 ENCOUNTER — Other Ambulatory Visit: Payer: Managed Care, Other (non HMO)

## 2015-12-25 DIAGNOSIS — D595 Paroxysmal nocturnal hemoglobinuria [Marchiafava-Micheli]: Secondary | ICD-10-CM | POA: Diagnosis not present

## 2015-12-25 DIAGNOSIS — R079 Chest pain, unspecified: Secondary | ICD-10-CM | POA: Diagnosis not present

## 2015-12-25 DIAGNOSIS — E785 Hyperlipidemia, unspecified: Secondary | ICD-10-CM | POA: Diagnosis not present

## 2016-01-05 DIAGNOSIS — R079 Chest pain, unspecified: Secondary | ICD-10-CM | POA: Diagnosis not present

## 2016-01-14 DIAGNOSIS — D595 Paroxysmal nocturnal hemoglobinuria [Marchiafava-Micheli]: Secondary | ICD-10-CM | POA: Diagnosis not present

## 2016-04-06 DIAGNOSIS — R5381 Other malaise: Secondary | ICD-10-CM | POA: Diagnosis not present

## 2016-04-06 DIAGNOSIS — R0789 Other chest pain: Secondary | ICD-10-CM | POA: Diagnosis not present

## 2016-04-06 DIAGNOSIS — E784 Other hyperlipidemia: Secondary | ICD-10-CM | POA: Diagnosis not present

## 2016-04-06 DIAGNOSIS — Z6827 Body mass index (BMI) 27.0-27.9, adult: Secondary | ICD-10-CM | POA: Diagnosis not present

## 2016-04-06 DIAGNOSIS — N402 Nodular prostate without lower urinary tract symptoms: Secondary | ICD-10-CM | POA: Diagnosis not present

## 2016-04-06 DIAGNOSIS — M2550 Pain in unspecified joint: Secondary | ICD-10-CM | POA: Diagnosis not present

## 2016-04-06 DIAGNOSIS — D613 Idiopathic aplastic anemia: Secondary | ICD-10-CM | POA: Diagnosis not present

## 2016-04-28 ENCOUNTER — Encounter: Payer: Self-pay | Admitting: Neurology

## 2016-04-28 ENCOUNTER — Ambulatory Visit (INDEPENDENT_AMBULATORY_CARE_PROVIDER_SITE_OTHER): Payer: Managed Care, Other (non HMO) | Admitting: Neurology

## 2016-04-28 VITALS — BP 110/72 | HR 80 | Resp 20 | Ht 68.0 in | Wt 168.0 lb

## 2016-04-28 DIAGNOSIS — R6889 Other general symptoms and signs: Secondary | ICD-10-CM

## 2016-04-28 DIAGNOSIS — R0683 Snoring: Secondary | ICD-10-CM | POA: Diagnosis not present

## 2016-04-28 DIAGNOSIS — G4752 REM sleep behavior disorder: Secondary | ICD-10-CM | POA: Diagnosis not present

## 2016-04-28 NOTE — Progress Notes (Signed)
SLEEP MEDICINE CLINIC   Provider:  Melvyn Novasarmen  Tyray Proch, M D  Referring Provider: Adrian PrinceSouth, Stephen, MD Primary Care Physician:  Julian HySOUTH,STEPHEN ALAN, MD  Chief Complaint  Patient presents with  . New Patient (Initial Visit)    possible sleep apnea    HPI:  Vincent Jensen is a 75 y.o. male , seen here as a referral  from Dr. Evlyn KannerSouth for Fatigue, daytime sleepiness, malaise as well as headaches. Dr. Evlyn Kannersouth also added that he had never had a sleep evaluation before. Mr. good patency himself is concerned that he may have sleep apnea. He has a normal body mass index is physically still active, and endorsed only occasional shortness of breath with exertion. He has undergone knee replacement surgery hip replacement and shoulder surgery as well. 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  The patient reports that he had been treated for PNH- paroxysmal nocturnal hematuria- hemoglobinuria , and chose years ago to stop drinking alcohol. He noted fatigue was severe at the time of diagnosis.  For treatment her was hospitalized and received infusions over  30 month, [facility] with Dr. Derrill KayGoodman. He was asked to undergo a sleep study as well as a thyroid disease workup, which Dr. Evlyn KannerSouth has initiated. The treatment of the hemoglobinuria his fatigue level has never been as severe again but he doesn't feel that has ever been energized and alert  to the pre-morbidity level. He noted that his soccer game is not as good, he feels decline in exercise tolerance !  Sleep habits are as follows: His usual bedtime is between 10 PM and midnight, he usually falls asleep promptly and stays asleep for 6 hours or more he usually wakes up spontaneously between 5:30 AM and 6 AM, very rarely does he have a bathroom break at night. His girlfriend has told him that he snores, but she did not necessarily state that he pauses in his breathing. He  wakes himself sometimes up with a  snort , a gasp.  He sleeps usually on one pillow only, he sleeps usually either supine or on either side, but never prone. The bedroom is described as core, quiet and dark. He uses a firm supportive mattress. Most nights he sleeps alone.His girlfriend sleeps over on week ends and brings her 40 pound dog to the bedroom.  He has once reported an enactment of a dream, woke to find he had arm locked his girlfriend.   Sleep medical history and family sleep history: no history of nightmares, no enuresis, no sleep walking.    Social history: divorced, remarried - his son took his life in [id number]. Daughter with 2 grandchildren.  PNH, alcohol overuse history, history of nicotine use until 1973.  Shift work history late shift, night shift - 1986 - 1991 ,  Caffeine use in AM only,  No soda , but tea in evening.   Review of Systems: Out of a complete 14 system review, the patient complains of only the following symptoms, and all other reviewed systems are negative.  no nocturia, vivid dreams , snoring, gasping,   Epworth score 3 , Fatigue severity score 16 , depression score 1/15    Social History   Social History  . Marital Status: Single    Spouse Name: N/A  . Number of Children: N/A  . Years of Education: N/A   Occupational History  . Not on file.   Social History Main Topics  .  Smoking status: Former Smoker -- 10 years    Quit date: 01/02/1972  . Smokeless tobacco: Never Used  . Alcohol Use: No  . Drug Use: No  . Sexual Activity: Not on file   Other Topics Concern  . Not on file   Social History Narrative    Family History  Problem Relation Age of Onset  . Myasthenia gravis Mother   . Cancer Father     Past Medical History  Diagnosis Date  . Aplastic anemia (HCC)   . 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 Diverticulosis   . Arthritis   . Prostate nodule - left lobe 08/29/2013    DRE 08/29/2013   . History of blood transfusion     Past Surgical History    Procedure Laterality Date  . Joint replacement    . Appendectomy    . Shoulder arthroscopy Left     left shoulder /replacement surg  . Knee surgery Bilateral     knee replacement surg/Bil  . Total hip arthroplasty Bilateral     Bil  . Tonsillectomy    . Colonoscopy    . Total shoulder arthroplasty Right 09/12/2014    Procedure: RIGHT TOTAL SHOULDER ARTHROPLASTY;  Surgeon: Senaida Lange, MD;  Location: [facility] OR;  Service: Orthopedics;  Laterality: Right;    Current Outpatient Prescriptions  Medication Sig Dispense Refill  . acetaminophen (TYLENOL) 500 MG tablet Take 500 mg by mouth every 6 (six) hours as needed (pain).     . Ascorbic Acid (VITAMIN C PO) Take 1 tablet by mouth every other day. Alternate with vitamin b complex    . aspirin 81 MG tablet Take 81 mg by mouth daily.    . B Complex-C (B-COMPLEX WITH VITAMIN C) tablet Take 1 tablet by mouth every other day. Alternate with vitamin c    . co-enzyme Q-10 30 MG capsule Take 30 mg by mouth 3 (three) times daily.    . Multiple Vitamin (MULITIVITAMIN WITH MINERALS) TABS Take 1 tablet by mouth daily.    . Red Yeast Rice Extract (RED YEAST RICE PO) Take 1 tablet by mouth 2 (two) times daily.    . tamsulosin (FLOMAX) 0.4 MG CAPS capsule Take 0.4 mg by mouth every evening.     . nitroGLYCERIN (NITROSTAT) 0.4 MG SL tablet Place 1 tablet (0.4 mg total) under the tongue every 5 (five) minutes x 3 doses as needed for chest pain. (Patient not taking: Reported on 08/30/2014) 15 tablet 3   No current facility-administered medications for this visit.    Allergies as of 04/28/2016  . (No Known Allergies)    Vitals: BP 110/72 mmHg  Pulse 80  Resp 20  Ht 5\' 8"  (1.727 m)  Wt 168 lb (76.204 kg)  BMI 25.55 kg/m2 Last Weight:  Wt Readings from Last 1 Encounters:  04/28/16 168 lb (76.204 kg)   UXL:KGMW mass index is 25.55 kg/(m^2).     Last Height:   Ht Readings from Last 1 Encounters:  04/28/16 5\' 8"  (1.727 m)    Physical  exam:  General: The patient is awake, alert and appears not in acute distress. The patient is well groomed. Head: Normocephalic, atraumatic. Neck is supple. Mallampati 1 neck circumference:16.5 . Nasal airflow patent ,  Cardiovascular:  Regular rate and rhythm , without  murmurs or carotid bruit, and without distended neck veins. Respiratory: Lungs are clear to auscultation. Skin:  Without evidence of edema, or rash Trunk: BMI is normal . The patient's  posture is erect   Neurologic exam : The patient is awake and alert, oriented to place and time.   Memory subjective  described as intact.  Attention span & concentration ability appears normal.  Speech is fluent,  without dysarthria, dysphonia or aphasia.  Mood and affect are appropriate.  Cranial nerves: Pupils are equal and briskly reactive to light. Funduscopic exam without  evidence of pallor or edema. Extraocular movements  in vertical and horizontal planes intact and without nystagmus. Visual fields by finger perimetry are intact. Hearing to finger rub intact.   Facial sensation intact to fine touch.  Facial motor strength is symmetric and tongue and uvula move midline. Shoulder shrug was symmetrical.   Motor exam: Normal tone, muscle bulk and symmetric strength in all extremities.He does have a trigger finger, dislocation of the small finger of the right hand. Normal preserved grip strength. Some rheumatoid changes to the interdigital joints and phalanx.  Sensory:  Fine touch, pinprick and vibration were tested in all extremities.  Proprioception tested in the upper extremities was normal. Coordination: Rapid alternating movements in the fingers/hands was normal. Finger-to-nose maneuver  normal without evidence of ataxia, dysmetria or tremor. Gait and station: Patient walks without assistive device and is able unassisted to climb up to the exam table. Strength within normal limits. Stance is stable and normal.   Deep tendon  reflexes: in the upper and lower extremities are symmetric and intact. Babinski maneuver response is downgoing.  The patient was advised of the nature of the diagnosed sleep disorder , the treatment options and risks for general a health and wellness arising from not treating the condition.  I spent more than 35 minutes of face to face time with the patient. Greater than 50% of time was spent in counseling and coordination of care. We have discussed the diagnosis and differential and I answered the patient's questions.     Assessment:  After physical and neurologic examination, review of laboratory studies,  Personal review of imaging studies, reports of other /same  Imaging studies ,  Results of polysomnography/ neurophysiology testing and pre-existing records as far as provided in visit., my assessment is   1)  Vincent Jensen is of ge[place] extraction, he has a normal body mass index, a normal neck size, and his upper airway does not show restriction. He is able to breathe through the nose. But he snores is possible but I doubt that he will present for significant apnea. He has woken himself snoring but he has not woken himself air hungry. I would like to evaluate him in a sleep study without capnography. I would like my technologist however to have a close eye on his oximetry. Please also make notes if there are periodic limb movements during REM sleep, given the one period of dream enactment reported. He wakes up with a desire to clean his throat   2) The patient reports loss of exercise tolerance. Loss of speed running during soccer games.    Plan:  Treatment plan and additional workup :  SPLIT night polysomnography, recording of parasomnia activity, watch for hypoxemia. History of anemia.      Porfirio Mylar Claudine Stallings MD  04/28/2016   CC: Adrian Prince, Md [address] [place], [place] [address]

## 2016-07-13 DIAGNOSIS — H02839 Dermatochalasis of unspecified eye, unspecified eyelid: Secondary | ICD-10-CM | POA: Diagnosis not present

## 2016-07-13 DIAGNOSIS — H2512 Age-related nuclear cataract, left eye: Secondary | ICD-10-CM | POA: Diagnosis not present

## 2016-07-13 DIAGNOSIS — H2511 Age-related nuclear cataract, right eye: Secondary | ICD-10-CM | POA: Diagnosis not present

## 2016-07-13 DIAGNOSIS — H40033 Anatomical narrow angle, bilateral: Secondary | ICD-10-CM | POA: Diagnosis not present

## 2016-07-26 DIAGNOSIS — H2511 Age-related nuclear cataract, right eye: Secondary | ICD-10-CM | POA: Diagnosis not present

## 2016-07-27 DIAGNOSIS — H2512 Age-related nuclear cataract, left eye: Secondary | ICD-10-CM | POA: Diagnosis not present

## 2016-08-13 DIAGNOSIS — H2512 Age-related nuclear cataract, left eye: Secondary | ICD-10-CM | POA: Diagnosis not present

## 2016-11-06 DIAGNOSIS — Z23 Encounter for immunization: Secondary | ICD-10-CM | POA: Diagnosis not present

## 2016-11-09 DIAGNOSIS — R197 Diarrhea, unspecified: Secondary | ICD-10-CM | POA: Diagnosis not present

## 2016-11-09 DIAGNOSIS — K5730 Diverticulosis of large intestine without perforation or abscess without bleeding: Secondary | ICD-10-CM | POA: Diagnosis not present

## 2016-11-09 DIAGNOSIS — D619 Aplastic anemia, unspecified: Secondary | ICD-10-CM | POA: Diagnosis not present

## 2016-11-09 DIAGNOSIS — R5381 Other malaise: Secondary | ICD-10-CM | POA: Diagnosis not present

## 2016-11-09 DIAGNOSIS — E784 Other hyperlipidemia: Secondary | ICD-10-CM | POA: Diagnosis not present

## 2016-11-09 DIAGNOSIS — J0100 Acute maxillary sinusitis, unspecified: Secondary | ICD-10-CM | POA: Diagnosis not present

## 2016-12-01 DIAGNOSIS — N402 Nodular prostate without lower urinary tract symptoms: Secondary | ICD-10-CM | POA: Diagnosis not present

## 2016-12-01 DIAGNOSIS — A0472 Enterocolitis due to Clostridium difficile, not specified as recurrent: Secondary | ICD-10-CM | POA: Diagnosis not present

## 2016-12-01 DIAGNOSIS — E784 Other hyperlipidemia: Secondary | ICD-10-CM | POA: Diagnosis not present

## 2016-12-01 DIAGNOSIS — D619 Aplastic anemia, unspecified: Secondary | ICD-10-CM | POA: Diagnosis not present

## 2016-12-01 DIAGNOSIS — Z6826 Body mass index (BMI) 26.0-26.9, adult: Secondary | ICD-10-CM | POA: Diagnosis not present

## 2017-01-17 ENCOUNTER — Telehealth: Payer: Self-pay

## 2017-01-17 NOTE — Telephone Encounter (Signed)
Left 3 messages for patient to schedule sleep study. Have not heard back.

## 2017-08-03 DIAGNOSIS — R05 Cough: Secondary | ICD-10-CM | POA: Diagnosis not present

## 2017-08-03 DIAGNOSIS — J0100 Acute maxillary sinusitis, unspecified: Secondary | ICD-10-CM | POA: Diagnosis not present

## 2017-08-03 DIAGNOSIS — Z6826 Body mass index (BMI) 26.0-26.9, adult: Secondary | ICD-10-CM | POA: Diagnosis not present

## 2018-01-02 DIAGNOSIS — R609 Edema, unspecified: Secondary | ICD-10-CM | POA: Diagnosis not present

## 2018-01-02 DIAGNOSIS — R0789 Other chest pain: Secondary | ICD-10-CM | POA: Diagnosis not present

## 2018-01-02 DIAGNOSIS — R7302 Impaired glucose tolerance (oral): Secondary | ICD-10-CM | POA: Diagnosis not present

## 2018-01-02 DIAGNOSIS — Z6826 Body mass index (BMI) 26.0-26.9, adult: Secondary | ICD-10-CM | POA: Diagnosis not present

## 2018-01-02 DIAGNOSIS — R42 Dizziness and giddiness: Secondary | ICD-10-CM | POA: Diagnosis not present

## 2018-01-02 DIAGNOSIS — N402 Nodular prostate without lower urinary tract symptoms: Secondary | ICD-10-CM | POA: Diagnosis not present

## 2018-01-02 DIAGNOSIS — Z1389 Encounter for screening for other disorder: Secondary | ICD-10-CM | POA: Diagnosis not present

## 2018-01-02 DIAGNOSIS — D595 Paroxysmal nocturnal hemoglobinuria [Marchiafava-Micheli]: Secondary | ICD-10-CM | POA: Diagnosis not present

## 2018-01-02 DIAGNOSIS — E291 Testicular hypofunction: Secondary | ICD-10-CM | POA: Diagnosis not present

## 2018-01-02 DIAGNOSIS — E785 Hyperlipidemia, unspecified: Secondary | ICD-10-CM | POA: Diagnosis not present

## 2018-01-18 ENCOUNTER — Other Ambulatory Visit: Payer: Self-pay

## 2018-01-18 ENCOUNTER — Ambulatory Visit (HOSPITAL_COMMUNITY): Payer: Managed Care, Other (non HMO) | Attending: Internal Medicine

## 2018-01-18 ENCOUNTER — Other Ambulatory Visit: Payer: Self-pay | Admitting: Endocrinology

## 2018-01-18 DIAGNOSIS — R609 Edema, unspecified: Secondary | ICD-10-CM | POA: Diagnosis not present

## 2018-01-18 DIAGNOSIS — R0789 Other chest pain: Secondary | ICD-10-CM

## 2018-01-18 DIAGNOSIS — Z8249 Family history of ischemic heart disease and other diseases of the circulatory system: Secondary | ICD-10-CM | POA: Insufficient documentation

## 2018-01-18 DIAGNOSIS — Z87891 Personal history of nicotine dependence: Secondary | ICD-10-CM | POA: Insufficient documentation

## 2018-04-26 DIAGNOSIS — M21611 Bunion of right foot: Secondary | ICD-10-CM | POA: Diagnosis not present

## 2018-04-26 DIAGNOSIS — R600 Localized edema: Secondary | ICD-10-CM | POA: Diagnosis not present

## 2018-04-26 DIAGNOSIS — Z6827 Body mass index (BMI) 27.0-27.9, adult: Secondary | ICD-10-CM | POA: Diagnosis not present

## 2018-04-26 DIAGNOSIS — R609 Edema, unspecified: Secondary | ICD-10-CM | POA: Diagnosis not present

## 2018-04-26 DIAGNOSIS — L0889 Other specified local infections of the skin and subcutaneous tissue: Secondary | ICD-10-CM | POA: Diagnosis not present

## 2018-05-10 ENCOUNTER — Ambulatory Visit (INDEPENDENT_AMBULATORY_CARE_PROVIDER_SITE_OTHER): Payer: Managed Care, Other (non HMO)

## 2018-05-10 ENCOUNTER — Encounter (INDEPENDENT_AMBULATORY_CARE_PROVIDER_SITE_OTHER): Payer: Self-pay | Admitting: Orthopedic Surgery

## 2018-05-10 ENCOUNTER — Ambulatory Visit (INDEPENDENT_AMBULATORY_CARE_PROVIDER_SITE_OTHER): Payer: Managed Care, Other (non HMO) | Admitting: Orthopedic Surgery

## 2018-05-10 DIAGNOSIS — M25561 Pain in right knee: Secondary | ICD-10-CM | POA: Diagnosis not present

## 2018-05-10 NOTE — Progress Notes (Signed)
Office Visit Note   Patient: Vincent Jensen           Date of Birth: 1941/05/17           MRN: [id number] Visit Date: 05/10/2018 Requested by: Adrian Prince, MD [address] [place], [place] [address] PCP: Adrian Prince, MD  Subjective: Chief Complaint  Patient presents with  . Right Knee - Pain    HPI: Patient has had bilateral knee replacements done.  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  He is 77 years old.  He sustained an injury when he fell on the right knee playing soccer 04/27/2018.  He did develop swelling around the MCL attachment site.  That is gone down.  Denies much in the way of new symptoms or any symptoms previous or prior to his current injury.               ROS: All systems reviewed are negative as they relate to the chief complaint within the history of present illness.  Patient denies  fevers or chills.   Assessment & Plan: gnoses:  1. Acute pain of right knee     Plan: Impression is right knee pain following direct impact injury.  MCL is stable to valgus stress at 0 and 30 degrees.  He does have a mild effusion and is hard to know if this effusion is from the impact or if this is from particle wear.  PCL feel stable but has grade 1 laxity but does have a good endpoint.  Plan at this time is for him to avoid soccer until mid August to give the soft tissue injury about 4 weeks to heal.  Follow-up with me in 3 months so I can recheck on the effusion.  The scalloping on the radiographs is slightly concerning for particle wear disease.  We will see him back in 3 months for clinical check on effusion.  Follow-Up Instructions: Return in about 3 months (around 08/10/2018).   Orders:  Orders Placed This Encounter  Procedures  . XR KNEE 3 VIEW RIGHT   No orders of the defined types were placed in this encounter.     Procedures: No procedures performed   Clinical Data: No additional findings.  Objective: Vital Signs: There were no vitals taken  for this visit.  Physical Exam:   Constitutional: Patient appears well-developed HEENT:  Head: Normocephalic Eyes:EOM are normal Neck: Normal range of motion Cardiovascular: Normal rate Pulmonary/chest: Effort normal Neurologic: Patient is alert Skin: Skin is warm Psychiatric: Patient has normal mood and affect    Ortho Exam: Orthopedic exam demonstrates full active and passive range of motion of hips.  Both knees also have excellent range of motion from full extension to well over 100 degrees of flexion.  Extensor mechanism is intact bilaterally.  He is stable to valgus stress at 0 and 30 degrees.  Does have some resolving ecchymosis along the proximal medial tibial region on the right.  Pedal pulses palpable.  Extensor mechanism is intact bilaterally.  Specialty Comments:  No specialty comments available.  Imaging: Xr Knee 3 View Right  Result Date: 05/10/2018 AP lateral merchant right knee reviewed.  Well fixed cemented posterior cruciate retaining prosthesis in good position and alignment.  There is very slight scalloping on the medial aspect of the medial tibial plateau and the medial femoral condyle.  No evidence of loosening at the bone cement interface.  Left total knee prosthesis appears to be in good position and alignment.  PMFS History: Patient Active Problem List   Diagnosis Date Noted  . S/P shoulder replacement 09/12/2014  . Prostate nodule - left lobe 08/29/2013  . HYPERLIPIDEMIA 05/22/2009  . ASTHMA W/ COPD 05/22/2009   Past Medical History:  Diagnosis Date  . Aplastic anemia (HCC)   . Arthritis   . Diverticulosis   . History of blood transfusion   . 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 Prostate nodule - left lobe 08/29/2013   DRE 08/29/2013     Family History  Problem Relation Age of Onset  . Myasthenia gravis Mother   . Cancer Father     Past Surgical History:  Procedure Laterality Date  . APPENDECTOMY    . COLONOSCOPY    .  JOINT REPLACEMENT    . KNEE SURGERY Bilateral    knee replacement surg/Bil  . SHOULDER ARTHROSCOPY Left    left shoulder /replacement surg  . TONSILLECTOMY    . TOTAL HIP ARTHROPLASTY Bilateral    Bil  . TOTAL SHOULDER ARTHROPLASTY Right 09/12/2014   Procedure: RIGHT TOTAL SHOULDER ARTHROPLASTY;  Surgeon: Senaida LangeKevin M Supple, MD;  Location: [facility] OR;  Service: Orthopedics;  Laterality: Right;   Social History   Occupational History  . Not on file  Tobacco Use  . Smoking status: Former Smoker    Years: 10.00    Last attempt to quit: 01/02/1972    Years since quitting: 46.3  . Smokeless tobacco: Never Used  Substance and Sexual Activity  . Alcohol use: No  . Drug use: No  . Sexual activity: Not on file

## 2018-05-16 ENCOUNTER — Encounter: Payer: Self-pay | Admitting: Podiatry

## 2018-05-16 ENCOUNTER — Ambulatory Visit (INDEPENDENT_AMBULATORY_CARE_PROVIDER_SITE_OTHER): Payer: Medicare Other | Admitting: Podiatry

## 2018-05-16 ENCOUNTER — Ambulatory Visit (INDEPENDENT_AMBULATORY_CARE_PROVIDER_SITE_OTHER): Payer: Managed Care, Other (non HMO)

## 2018-05-16 VITALS — BP 104/64 | HR 66 | Resp 16

## 2018-05-16 DIAGNOSIS — M2012 Hallux valgus (acquired), left foot: Secondary | ICD-10-CM

## 2018-05-16 DIAGNOSIS — M2011 Hallux valgus (acquired), right foot: Secondary | ICD-10-CM

## 2018-05-16 DIAGNOSIS — M2042 Other hammer toe(s) (acquired), left foot: Secondary | ICD-10-CM

## 2018-05-16 DIAGNOSIS — B353 Tinea pedis: Secondary | ICD-10-CM

## 2018-05-16 DIAGNOSIS — M2041 Other hammer toe(s) (acquired), right foot: Secondary | ICD-10-CM

## 2018-05-16 DIAGNOSIS — K5790 Diverticulosis of intestine, part unspecified, without perforation or abscess without bleeding: Secondary | ICD-10-CM | POA: Insufficient documentation

## 2018-05-16 MED ORDER — KETOCONAZOLE 2 % EX CREA: 1.0000 "application " | TOPICAL_CREAM | Freq: Two times a day (BID) | CUTANEOUS | 2 refills | Status: DC

## 2018-05-17 NOTE — Progress Notes (Signed)
Subjective:  Patient ID: Vincent Jensen, male    DOB: 1940-12-25,  MRN: [id number] HPI Chief Complaint  Patient presents with  . Foot Pain    1st MPJ bilateral (R>L) - severe deformities x years, pain is getting worse, shoes are bothersome  . Skin Problem    Plantar foot right - red, scaly areas, tried multiple creams  . New Patient (Initial Visit)    77 y.o. male presents with the above complaint.   ROS: He denies fever chills nausea vomiting muscle aches pains calf pain back pain chest pain shortness of breath.  Past Medical History:  Diagnosis Date  . Aplastic anemia (HCC)   . Arthritis   . Diverticulosis   . History of blood transfusion   . 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 Prostate nodule - left lobe 08/29/2013   DRE 08/29/2013    Past Surgical History:  Procedure Laterality Date  . APPENDECTOMY    . COLONOSCOPY    . JOINT REPLACEMENT    . KNEE SURGERY Bilateral    knee replacement surg/Bil  . SHOULDER ARTHROSCOPY Left    left shoulder /replacement surg  . TONSILLECTOMY    . TOTAL HIP ARTHROPLASTY Bilateral    Bil  . TOTAL SHOULDER ARTHROPLASTY Right 09/12/2014   Procedure: RIGHT TOTAL SHOULDER ARTHROPLASTY;  Surgeon: Senaida Lange, MD;  Location: [facility] OR;  Service: Orthopedics;  Laterality: Right;    Current Outpatient Medications:  .  acetaminophen (TYLENOL) 500 MG tablet, Take 500 mg by mouth every 6 (six) hours as needed (pain). , Disp: , Rfl:  .  Ascorbic Acid (VITAMIN C PO), Take 1 tablet by mouth every other day. Alternate with vitamin b complex, Disp: , Rfl:  .  aspirin 81 MG tablet, Take 81 mg by mouth daily., Disp: , Rfl:  .  B Complex-C (B-COMPLEX WITH VITAMIN C) tablet, Take 1 tablet by mouth every other day. Alternate with vitamin c, Disp: , Rfl:  .  co-enzyme Q-10 30 MG capsule, Take 30 mg by mouth 3 (three) times daily., Disp: , Rfl:  .  ketoconazole (NIZORAL) 2 % cream, Apply 1 application topically 2 (two) times daily., Disp: 15  g, Rfl: 2 .  Multiple Vitamin (MULITIVITAMIN WITH MINERALS) TABS, Take 1 tablet by mouth daily., Disp: , Rfl:  .  nitroGLYCERIN (NITROSTAT) 0.4 MG SL tablet, Place 1 tablet (0.4 mg total) under the tongue every 5 (five) minutes x 3 doses as needed for chest pain. (Patient not taking: Reported on 08/30/2014), Disp: 15 tablet, Rfl: 3 .  Red Yeast Rice Extract (RED YEAST RICE PO), Take 1 tablet by mouth 2 (two) times daily., Disp: , Rfl:  .  tamsulosin (FLOMAX) 0.4 MG CAPS capsule, Take 0.4 mg by mouth every evening. , Disp: , Rfl:   No Known Allergies Review of Systems Objective:   Vitals:   05/16/18 0838  BP: 104/64  Pulse: 66  Resp: 16    General: Well developed, nourished, in no acute distress, alert and oriented x3   Dermatological: Skin is warm, dry and supple bilateral. Nails x 10 are well maintained; remaining integument appears unremarkable at this time. There are no open sores, no preulcerative lesions, no rash or signs of infection present.  Small red vesicular lesions plantar aspect of the medial longitudinal arch right greater than left indicative of tinea.  Vascular: Dorsalis Pedis artery and Posterior Tibial artery pedal pulses are 2/4 bilateral with immedate capillary fill time. Pedal hair growth present. No  varicosities and no lower extremity edema present bilateral.   Neruologic: Grossly intact via light touch bilateral. Vibratory intact via tuning fork bilateral. Protective threshold with Semmes Wienstein monofilament intact to all pedal sites bilateral. Patellar and Achilles deep tendon reflexes 2+ bilateral. No Babinski or clonus noted bilateral.   Musculoskeletal: No gross boney pedal deformities bilateral. No pain, crepitus, or limitation noted with foot and ankle range of motion bilateral. Muscular strength 5/5 in all groups tested bilateral.  Severe digital deformities severe hallux valgus deformities right foot.  Gait: Unassisted, Nonantalgic.     Radiographs:  Radiographs taken today demonstrate severe bunion deformities with dislocation of all of the toes on the right foot from the metatarsal phalangeal joints.  Lateral dislocation is noted appears to be rheumatoid though there are no new destructive lesions.  Left foot demonstrates similar findings but not as severe.  Assessment & Plan:   Assessment: Severe foot deformities bilateral.  Tinea pedis bilateral.  Plan: Discussed etiology pathology conservative versus surgical therapies.  At this point I explained to him that it would take him completely out of soccer more than likely for the rest of his life and that a severe rheumatoid type repair would have to be done or amputation of the forefoot.  He states that they really do not hurt so he is going to leave them as they are I did recommend ketoconazole and wrote a prescription for that for the tinea pedis.  Follow-up with him in 1 month if that has not resolved.     Shukri Nistler T. Ester, [place]

## 2018-05-22 ENCOUNTER — Ambulatory Visit: Payer: Medicare Other | Admitting: Orthotics

## 2018-05-22 DIAGNOSIS — M2012 Hallux valgus (acquired), left foot: Secondary | ICD-10-CM

## 2018-05-22 DIAGNOSIS — M2011 Hallux valgus (acquired), right foot: Secondary | ICD-10-CM

## 2018-05-22 NOTE — Progress Notes (Signed)
Patient hasn't yet decided he wants to spend 398 on foot orthotics; I molded him in foam and told him to think about this week.  Very pleasant man.

## 2018-07-13 ENCOUNTER — Ambulatory Visit (INDEPENDENT_AMBULATORY_CARE_PROVIDER_SITE_OTHER): Payer: Medicare Other | Admitting: Orthopedic Surgery

## 2018-07-19 ENCOUNTER — Ambulatory Visit (INDEPENDENT_AMBULATORY_CARE_PROVIDER_SITE_OTHER): Payer: Managed Care, Other (non HMO) | Admitting: Orthopedic Surgery

## 2018-07-19 ENCOUNTER — Encounter (INDEPENDENT_AMBULATORY_CARE_PROVIDER_SITE_OTHER): Payer: Self-pay | Admitting: Orthopedic Surgery

## 2018-07-19 DIAGNOSIS — M25561 Pain in right knee: Secondary | ICD-10-CM | POA: Diagnosis not present

## 2018-07-19 NOTE — Progress Notes (Signed)
Office Visit Note   Patient: Vincent Jensen           Date of Birth: April 26, 1941           MRN: [id number] Visit Date: 07/19/2018 Requested by: Adrian Prince, MD [address] [place], [place] [address] PCP: Adrian Prince, MD  Subjective: Chief Complaint  Patient presents with  . Right Knee - Follow-up    HPI: Patient presents for evaluation of right knee and left knee.  He had an injury to his right knee several months ago.  Had a little swelling at the time but he is actually improved significantly.  He is playing soccer and racquetball.  Does this about 4 days out of 7.  He has no current issues with pain.  I have cautioned him against too much activity on his total joints.              ROS: All systems reviewed are negative as they relate to the chief complaint within the history of present illness.  Patient denies  fevers or chills.   Assessment & Plan: Visit Diagnoses:  1. Acute pain of right knee     Plan: Impression is mild effusion right knee with no loose bodies but potential for poly-wear.  He has a little bit of scalloping on that medial aspect of the distal medial femoral condyle.  I think I would like to watch this and see him back in 6 months with repeat radiographs just to see if any of that is changing.  Left knee is doing well.  No effusion there but occasionally there is some crepitus.  See him back in 6 months with repeat x-rays on that right knee at that time  Follow-Up Instructions: Return in about 6 months (around 01/18/2019).   Orders:  No orders of the defined types were placed in this encounter.  No orders of the defined types were placed in this encounter.     Procedures: No procedures performed   Clinical Data: No additional findings.  Objective: Vital Signs: There were no vitals taken for this visit.  Physical Exam:   Constitutional: Patient appears well-developed HEENT:  Head: Normocephalic Eyes:EOM are normal Neck: Normal range of  motion Cardiovascular: Normal rate Pulmonary/chest: Effort normal Neurologic: Patient is alert Skin: Skin is warm Psychiatric: Patient has normal mood and affect    Ortho Exam: Ortho exam demonstrates mild effusion right knee no effusion left knee.  Range of motion is excellent in both knees with flexion easily past 90.  Collaterals are stable.  Pedal pulses palpable.  PCL feels like it is intact with decent endpoint.  Quad strength is excellent bilaterally.  Specialty Comments:  No specialty comments available.  Imaging: No results found.   PMFS History: Patient Active Problem List   Diagnosis Date Noted  . Diverticulosis 05/16/2018  . S/P shoulder replacement 09/12/2014  . Prostate nodule - left lobe 08/29/2013  . Iron deficiency 03/22/2013  . Fatigue 12/27/2012  . Acute kidney injury (HCC) 04/12/2012  . Cyclosporine toxicity, therapeutic use 04/12/2012  . Cyst of left kidney 04/12/2012  . Osteoarthrosis involving shoulder region 02/09/2012  . Aplastic anemia (HCC) 09/29/2011  . Paroxysmal nocturnal hemoglobinuria (PNH) (HCC) 08/12/2011  . HYPERLIPIDEMIA 05/22/2009  . ASTHMA W/ COPD 05/22/2009   Past Medical History:  Diagnosis Date  . Aplastic anemia (HCC)   . Arthritis   . Diverticulosis   . History of blood transfusion   . 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  Prostate nodule - left lobe 08/29/2013   DRE 08/29/2013     Family History  Problem Relation Age of Onset  . Myasthenia gravis Mother   . Cancer Father     Past Surgical History:  Procedure Laterality Date  . APPENDECTOMY    . COLONOSCOPY    . JOINT REPLACEMENT    . KNEE SURGERY Bilateral    knee replacement surg/Bil  . SHOULDER ARTHROSCOPY Left    left shoulder /replacement surg  . TONSILLECTOMY    . TOTAL HIP ARTHROPLASTY Bilateral    Bil  . TOTAL SHOULDER ARTHROPLASTY Right 09/12/2014   Procedure: RIGHT TOTAL SHOULDER ARTHROPLASTY;  Surgeon: Senaida Lange, MD;  Location: [facility]  OR;  Service: Orthopedics;  Laterality: Right;   Social History   Occupational History  . Not on file  Tobacco Use  . Smoking status: Former Smoker    Years: 10.00    Last attempt to quit: 01/02/1972    Years since quitting: 46.5  . Smokeless tobacco: Never Used  Substance and Sexual Activity  . Alcohol use: No  . Drug use: No  . Sexual activity: Not on file

## 2019-01-16 DIAGNOSIS — E785 Hyperlipidemia, unspecified: Secondary | ICD-10-CM | POA: Diagnosis not present

## 2019-01-16 DIAGNOSIS — N401 Enlarged prostate with lower urinary tract symptoms: Secondary | ICD-10-CM | POA: Diagnosis not present

## 2019-01-16 DIAGNOSIS — R7302 Impaired glucose tolerance (oral): Secondary | ICD-10-CM | POA: Diagnosis not present

## 2019-01-16 DIAGNOSIS — R42 Dizziness and giddiness: Secondary | ICD-10-CM | POA: Diagnosis not present

## 2019-01-16 DIAGNOSIS — M2550 Pain in unspecified joint: Secondary | ICD-10-CM | POA: Diagnosis not present

## 2019-01-16 DIAGNOSIS — D595 Paroxysmal nocturnal hemoglobinuria [Marchiafava-Micheli]: Secondary | ICD-10-CM | POA: Diagnosis not present

## 2019-01-16 DIAGNOSIS — D619 Aplastic anemia, unspecified: Secondary | ICD-10-CM | POA: Diagnosis not present

## 2019-01-18 ENCOUNTER — Ambulatory Visit (INDEPENDENT_AMBULATORY_CARE_PROVIDER_SITE_OTHER): Payer: Managed Care, Other (non HMO)

## 2019-01-18 ENCOUNTER — Other Ambulatory Visit: Payer: Self-pay

## 2019-01-18 ENCOUNTER — Ambulatory Visit (INDEPENDENT_AMBULATORY_CARE_PROVIDER_SITE_OTHER): Payer: Managed Care, Other (non HMO) | Admitting: Orthopedic Surgery

## 2019-01-18 ENCOUNTER — Encounter (INDEPENDENT_AMBULATORY_CARE_PROVIDER_SITE_OTHER): Payer: Self-pay | Admitting: Orthopedic Surgery

## 2019-01-18 DIAGNOSIS — M25561 Pain in right knee: Secondary | ICD-10-CM

## 2019-01-18 NOTE — Progress Notes (Signed)
Office Visit Note   Patient: Vincent Jensen           Date of Birth: 03/15/1941           MRN: [id number] Visit Date: 01/18/2019 Requested by: Adrian Prince, MD [address] [place], [place] [address] PCP: Adrian Prince, MD  Subjective: Chief Complaint  Patient presents with  . Right Knee - Follow-up    HPI: Patient presents for follow-up of right knee.  He had a little bit of scalloping on that medial femoral condyle.  Had a little effusion last clinic visit as well but this was after an injury.  Not having any symptoms.  Still play soccer about twice a week.  Is very active for his age.  Has occasional discomfort.  Otherwise doing well.              ROS: All systems reviewed are negative as they relate to the chief complaint within the history of present illness.  Patient denies  fevers or chills.   Assessment & Plan: Visit Diagnoses:  1. Acute pain of right knee     Plan: Impression is no effusion and no radiographic change to the right knee.  Concern last visit was for possible polyethylene wear but there is no effusion today and no change on radiographs.  I like to see him back with repeat radiographs in about 18 months.  Follow-Up Instructions: Return in about 18 months (around 07/19/2020).   Orders:  Orders Placed This Encounter  Procedures  . XR Knee 1-2 Views Right   No orders of the defined types were placed in this encounter.     Procedures: No procedures performed   Clinical Data: No additional findings.  Objective: Vital Signs: There were no vitals taken for this visit.  Physical Exam:   Constitutional: Patient appears well-developed HEENT:  Head: Normocephalic Eyes:EOM are normal Neck: Normal range of motion Cardiovascular: Normal rate Pulmonary/chest: Effort normal Neurologic: Patient is alert Skin: Skin is warm Psychiatric: Patient has normal mood and affect    Ortho Exam: Ortho exam demonstrates a little bit more crepitus on the  left knee compared to the right with passive range of motion.  Extensor mechanism is intact.  Range of motion is excellent.  No effusion in either knee.  Collateral crucial ligaments are stable on the right left-hand side.  No focal joint line tenderness on that right knee.  Specialty Comments:  No specialty comments available.  Imaging: Xr Knee 1-2 Views Right  Result Date: 01/18/2019 AP lateral right knee reviewed.  Total knee prosthesis in good position alignment.  Scalloping on the medial femoral condyle and medial tibial plateau unchanged in appearance compared to 6 weeks ago.  No acute fracture or evidence of loosening of either component in the tibia or femur.    PMFS History: Patient Active Problem List   Diagnosis Date Noted  . Diverticulosis 05/16/2018  . S/P shoulder replacement 09/12/2014  . Prostate nodule - left lobe 08/29/2013  . Iron deficiency 03/22/2013  . Fatigue 12/27/2012  . Acute kidney injury (HCC) 04/12/2012  . Cyclosporine toxicity, therapeutic use 04/12/2012  . Cyst of left kidney 04/12/2012  . Osteoarthrosis involving shoulder region 02/09/2012  . Aplastic anemia (HCC) 09/29/2011  . Paroxysmal nocturnal hemoglobinuria (PNH) (HCC) 08/12/2011  . HYPERLIPIDEMIA 05/22/2009  . ASTHMA W/ COPD 05/22/2009   Past Medical History:  Diagnosis Date  . Aplastic anemia (HCC)   . Arthritis   . Diverticulosis   . History of blood  transfusion   . 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 Prostate nodule - left lobe 08/29/2013   DRE 08/29/2013     Family History  Problem Relation Age of Onset  . Myasthenia gravis Mother   . Cancer Father     Past Surgical History:  Procedure Laterality Date  . APPENDECTOMY    . COLONOSCOPY    . JOINT REPLACEMENT    . KNEE SURGERY Bilateral    knee replacement surg/Bil  . SHOULDER ARTHROSCOPY Left    left shoulder /replacement surg  . TONSILLECTOMY    . TOTAL HIP ARTHROPLASTY Bilateral    Bil  . TOTAL SHOULDER  ARTHROPLASTY Right 09/12/2014   Procedure: RIGHT TOTAL SHOULDER ARTHROPLASTY;  Surgeon: Senaida LangeKevin M Supple, MD;  Location: [facility] OR;  Service: Orthopedics;  Laterality: Right;   Social History   Occupational History  . Not on file  Tobacco Use  . Smoking status: Former Smoker    Years: 10.00    Last attempt to quit: 01/02/1972    Years since quitting: 47.0  . Smokeless tobacco: Never Used  Substance and Sexual Activity  . Alcohol use: No  . Drug use: No  . Sexual activity: Not on file

## 2019-03-12 DIAGNOSIS — Z961 Presence of intraocular lens: Secondary | ICD-10-CM | POA: Diagnosis not present

## 2019-03-12 DIAGNOSIS — H04123 Dry eye syndrome of bilateral lacrimal glands: Secondary | ICD-10-CM | POA: Diagnosis not present

## 2019-05-30 DIAGNOSIS — M25511 Pain in right shoulder: Secondary | ICD-10-CM | POA: Diagnosis not present

## 2019-05-30 DIAGNOSIS — Z96611 Presence of right artificial shoulder joint: Secondary | ICD-10-CM | POA: Diagnosis not present

## 2019-07-19 DIAGNOSIS — D595 Paroxysmal nocturnal hemoglobinuria [Marchiafava-Micheli]: Secondary | ICD-10-CM | POA: Diagnosis not present

## 2019-07-19 DIAGNOSIS — R7302 Impaired glucose tolerance (oral): Secondary | ICD-10-CM | POA: Diagnosis not present

## 2019-07-19 DIAGNOSIS — Z1331 Encounter for screening for depression: Secondary | ICD-10-CM | POA: Diagnosis not present

## 2019-07-19 DIAGNOSIS — E785 Hyperlipidemia, unspecified: Secondary | ICD-10-CM | POA: Diagnosis not present

## 2019-07-19 DIAGNOSIS — R42 Dizziness and giddiness: Secondary | ICD-10-CM | POA: Diagnosis not present

## 2019-07-19 DIAGNOSIS — N402 Nodular prostate without lower urinary tract symptoms: Secondary | ICD-10-CM | POA: Diagnosis not present

## 2019-07-19 DIAGNOSIS — Z1339 Encounter for screening examination for other mental health and behavioral disorders: Secondary | ICD-10-CM | POA: Diagnosis not present

## 2019-07-19 DIAGNOSIS — Z0000 Encounter for general adult medical examination without abnormal findings: Secondary | ICD-10-CM | POA: Diagnosis not present

## 2019-07-19 DIAGNOSIS — N401 Enlarged prostate with lower urinary tract symptoms: Secondary | ICD-10-CM | POA: Diagnosis not present

## 2019-07-19 DIAGNOSIS — D619 Aplastic anemia, unspecified: Secondary | ICD-10-CM | POA: Diagnosis not present

## 2019-10-24 ENCOUNTER — Ambulatory Visit: Payer: Medicare Other | Attending: Internal Medicine

## 2019-10-24 DIAGNOSIS — Z23 Encounter for immunization: Secondary | ICD-10-CM | POA: Insufficient documentation

## 2019-10-24 NOTE — Progress Notes (Signed)
   Covid-19 Vaccination Clinic  Name:  Vincent Jensen    MRN: [id number] DOB: 1941/09/15  10/24/2019  [name] was observed post Covid-19 immunization for 15 minutes without incidence. He was provided with Vaccine Information Sheet and instruction to access the V-Safe system.   [name] was instructed to call 911 with any severe reactions post vaccine: Marland Kitchen Difficulty breathing  . Swelling of your face and throat  . A fast heartbeat  . A bad rash all over your body  . Dizziness and weakness    Immunizations Administered    Name Date Dose VIS Date Route   Pfizer COVID-19 Vaccine 10/24/2019  8:41 AM 0.3 mL 09/21/2019 Intramuscular   Manufacturer: [employer], [employer]   Lot: [id number]   NDC: [address]-1

## 2019-11-13 ENCOUNTER — Ambulatory Visit: Payer: Medicare Other | Attending: Internal Medicine

## 2019-11-13 DIAGNOSIS — Z23 Encounter for immunization: Secondary | ICD-10-CM | POA: Insufficient documentation

## 2019-11-13 NOTE — Progress Notes (Signed)
   Covid-19 Vaccination Clinic  Name:  Juelz Whittenberg    MRN: [id number] DOB: [birth year]  11/13/2019  Mr. Benney was observed post Covid-19 immunization for 15 minutes without incidence. He was provided with Vaccine Information Sheet and instruction to access the V-Safe system.   Mr. Litt was instructed to call 911 with any severe reactions post vaccine: Marland Kitchen Difficulty breathing  . Swelling of your face and throat  . A fast heartbeat  . A bad rash all over your body  . Dizziness and weakness    Immunizations Administered    Name Date Dose VIS Date Route   Pfizer COVID-19 Vaccine 11/13/2019  8:17 AM 0.3 mL 09/21/2019 Intramuscular   Manufacturer: [employer], [employer]   Lot: [id number]   NDC: [address]-3

## 2020-06-04 ENCOUNTER — Ambulatory Visit: Payer: Self-pay | Admitting: Orthopedic Surgery

## 2020-06-09 ENCOUNTER — Ambulatory Visit (INDEPENDENT_AMBULATORY_CARE_PROVIDER_SITE_OTHER): Payer: Medicare Other

## 2020-06-09 ENCOUNTER — Ambulatory Visit (INDEPENDENT_AMBULATORY_CARE_PROVIDER_SITE_OTHER): Payer: Medicare Other | Admitting: Orthopedic Surgery

## 2020-06-09 DIAGNOSIS — M25561 Pain in right knee: Secondary | ICD-10-CM

## 2020-06-09 DIAGNOSIS — G8929 Other chronic pain: Secondary | ICD-10-CM

## 2020-06-10 ENCOUNTER — Encounter: Payer: Self-pay | Admitting: Orthopedic Surgery

## 2020-06-10 NOTE — Progress Notes (Signed)
Office Visit Note   Patient: Vincent Jensen           Date of Birth: [birth year]           MRN: [id number] Visit Date: 06/09/2020 Requested by: Adrian Prince, MD [address] [place],  [place] [address] PCP: Adrian Prince, MD  Subjective: Chief Complaint  Patient presents with   Right Knee - Follow-up    HPI: Patient presents for follow-up of both knees.  Patient states he is doing great.  He is to bike a lot but not so much anymore.  He has been playing soccer as well.  Doing pickleball.  Right total knee done by me 2005 left total knee done in 2003 in [place].  He is also had both hips done.  He is a very active 79 year old patient.  He was let go from his job as a rehired him 6 months later.  He currently is working.              ROS: All systems reviewed are negative as they relate to the chief complaint within the history of present illness.  Patient denies  fevers or chills.   Assessment & Plan: Visit Diagnoses:  1. Chronic pain of right knee     Plan: Impression is well-functioning knee replacements in an active 79 year old patient.  No effusion in either knee.  No evidence of osteolysis or bone cement interface lucencies.  Follow-up as needed.  I do want him to be generally careful with his activity level.  Follow-Up Instructions: Return if symptoms worsen or fail to improve.   Orders:  Orders Placed This Encounter  Procedures   XR KNEE 3 VIEW RIGHT   No orders of the defined types were placed in this encounter.     Procedures: No procedures performed   Clinical Data: No additional findings.  Objective: Vital Signs: There were no vitals taken for this visit.  Physical Exam:   Constitutional: Patient appears well-developed HEENT:  Head: Normocephalic Eyes:EOM are normal Neck: Normal range of motion Cardiovascular: Normal rate Pulmonary/chest: Effort normal Neurologic: Patient is alert Skin: Skin is warm Psychiatric: Patient has normal mood and  affect    Ortho Exam: Ortho exam demonstrates excellent range of motion in both knees full extension to about 1 15-1 20 of flexion.  No effusion.  Collateral ligaments are stable.  No groin pain with internal X rotation of either leg.  Specialty Comments:  No specialty comments available.  Imaging: No results found.   PMFS History: Patient Active Problem List   Diagnosis Date Noted   Diverticulosis 05/16/2018   S/P shoulder replacement 09/12/2014   Prostate nodule - left lobe 08/29/2013   Iron deficiency 03/22/2013   Fatigue 12/27/2012   Acute kidney injury (HCC) 04/12/2012   Cyclosporine toxicity, therapeutic use 04/12/2012   Cyst of left kidney 04/12/2012   Osteoarthrosis involving shoulder region 02/09/2012   Aplastic anemia (HCC) 09/29/2011   Paroxysmal nocturnal hemoglobinuria (PNH) (HCC) 08/12/2011   HYPERLIPIDEMIA 05/22/2009   ASTHMA W/ COPD 05/22/2009   Past Medical History:  Diagnosis Date   Aplastic anemia (HCC)    Arthritis    Diverticulosis    History of blood transfusion    PNH (paroxysmal nocturnal hemoglobinuria) (HCC)    Prostate nodule - left lobe 08/29/2013   DRE 08/29/2013     Family History  Problem Relation Age of Onset   Myasthenia gravis Mother    Cancer Father     Past Surgical History:  Procedure Laterality Date   APPENDECTOMY     COLONOSCOPY     JOINT REPLACEMENT     KNEE SURGERY Bilateral    knee replacement surg/Bil   SHOULDER ARTHROSCOPY Left    left shoulder /replacement surg   TONSILLECTOMY     TOTAL HIP ARTHROPLASTY Bilateral    Bil   TOTAL SHOULDER ARTHROPLASTY Right 09/12/2014   Procedure: RIGHT TOTAL SHOULDER ARTHROPLASTY;  Surgeon: Senaida Lange, MD;  Location: [facility] OR;  Service: Orthopedics;  Laterality: Right;   Social History   Occupational History   Not on file  Tobacco Use   Smoking status: Former Smoker    Years: 10.00    Quit date: 01/02/1972    Years since quitting: 48.4    Smokeless tobacco: Never Used  Substance and Sexual Activity   Alcohol use: No   Drug use: No   Sexual activity: Not on file

## 2020-07-14 DIAGNOSIS — R7302 Impaired glucose tolerance (oral): Secondary | ICD-10-CM | POA: Diagnosis not present

## 2020-07-14 DIAGNOSIS — Z125 Encounter for screening for malignant neoplasm of prostate: Secondary | ICD-10-CM | POA: Diagnosis not present

## 2020-07-14 DIAGNOSIS — E785 Hyperlipidemia, unspecified: Secondary | ICD-10-CM | POA: Diagnosis not present

## 2020-07-21 DIAGNOSIS — E785 Hyperlipidemia, unspecified: Secondary | ICD-10-CM | POA: Diagnosis not present

## 2020-07-21 DIAGNOSIS — I2584 Coronary atherosclerosis due to calcified coronary lesion: Secondary | ICD-10-CM | POA: Diagnosis not present

## 2020-07-21 DIAGNOSIS — R519 Headache, unspecified: Secondary | ICD-10-CM | POA: Diagnosis not present

## 2020-07-21 DIAGNOSIS — Z1331 Encounter for screening for depression: Secondary | ICD-10-CM | POA: Diagnosis not present

## 2020-07-21 DIAGNOSIS — M1990 Unspecified osteoarthritis, unspecified site: Secondary | ICD-10-CM | POA: Diagnosis not present

## 2020-07-21 DIAGNOSIS — I2510 Atherosclerotic heart disease of native coronary artery without angina pectoris: Secondary | ICD-10-CM | POA: Diagnosis not present

## 2020-07-21 DIAGNOSIS — R7302 Impaired glucose tolerance (oral): Secondary | ICD-10-CM | POA: Diagnosis not present

## 2020-07-21 DIAGNOSIS — N401 Enlarged prostate with lower urinary tract symptoms: Secondary | ICD-10-CM | POA: Diagnosis not present

## 2020-07-21 DIAGNOSIS — Z23 Encounter for immunization: Secondary | ICD-10-CM | POA: Diagnosis not present

## 2020-07-21 DIAGNOSIS — Z0000 Encounter for general adult medical examination without abnormal findings: Secondary | ICD-10-CM | POA: Diagnosis not present

## 2020-07-21 DIAGNOSIS — D595 Paroxysmal nocturnal hemoglobinuria [Marchiafava-Micheli]: Secondary | ICD-10-CM | POA: Diagnosis not present

## 2020-07-21 DIAGNOSIS — K5730 Diverticulosis of large intestine without perforation or abscess without bleeding: Secondary | ICD-10-CM | POA: Diagnosis not present

## 2020-07-21 DIAGNOSIS — D619 Aplastic anemia, unspecified: Secondary | ICD-10-CM | POA: Diagnosis not present

## 2020-07-21 DIAGNOSIS — R82998 Other abnormal findings in urine: Secondary | ICD-10-CM | POA: Diagnosis not present

## 2020-07-21 DIAGNOSIS — I5189 Other ill-defined heart diseases: Secondary | ICD-10-CM | POA: Diagnosis not present

## 2020-07-31 ENCOUNTER — Other Ambulatory Visit: Payer: Self-pay

## 2020-07-31 ENCOUNTER — Ambulatory Visit: Payer: Medicare Other | Admitting: Cardiology

## 2020-07-31 ENCOUNTER — Encounter: Payer: Self-pay | Admitting: Cardiology

## 2020-07-31 VITALS — BP 130/74 | HR 72 | Ht 68.0 in | Wt 166.0 lb

## 2020-07-31 DIAGNOSIS — R0609 Other forms of dyspnea: Secondary | ICD-10-CM

## 2020-07-31 DIAGNOSIS — I2584 Coronary atherosclerosis due to calcified coronary lesion: Secondary | ICD-10-CM | POA: Diagnosis not present

## 2020-07-31 DIAGNOSIS — I2510 Atherosclerotic heart disease of native coronary artery without angina pectoris: Secondary | ICD-10-CM

## 2020-07-31 DIAGNOSIS — R072 Precordial pain: Secondary | ICD-10-CM | POA: Diagnosis not present

## 2020-07-31 DIAGNOSIS — I209 Angina pectoris, unspecified: Secondary | ICD-10-CM | POA: Diagnosis not present

## 2020-07-31 DIAGNOSIS — Z87891 Personal history of nicotine dependence: Secondary | ICD-10-CM | POA: Diagnosis not present

## 2020-07-31 DIAGNOSIS — E782 Mixed hyperlipidemia: Secondary | ICD-10-CM | POA: Diagnosis not present

## 2020-07-31 MED ORDER — ATORVASTATIN CALCIUM 20 MG PO TABS: 20.0000 mg | ORAL_TABLET | Freq: Every day | ORAL | 0 refills | Status: DC

## 2020-07-31 MED ORDER — METOPROLOL TARTRATE 25 MG PO TABS: 25.0000 mg | ORAL_TABLET | Freq: Two times a day (BID) | ORAL | 0 refills | Status: DC

## 2020-07-31 MED ORDER — NITROGLYCERIN 0.4 MG SL SUBL: 0.4000 mg | SUBLINGUAL_TABLET | SUBLINGUAL | 0 refills | Status: DC | PRN

## 2020-07-31 NOTE — Progress Notes (Signed)
Date:  07/31/2020   ID:  Vincent Jensen, DOB 1941-10-03, MRN [id number]  PCP:  Reynold Bowen, MD  Cardiologist:  Rex Kras, DO, [facility] (established care 07/31/2020)  REASON FOR CONSULT: Encounter for general adult examination, cardiac calcification, throat tightness.   REQUESTING PHYSICIAN:  Reynold Bowen, [place],  [place] [address]  Chief Complaint  Patient presents with  . Coronary Artery Disease    pt c/o chest pain  . New Patient (Initial Visit)    HPI  Vincent Jensen is a 79 y.o. male who presents to the office with a chief complaint of " chest pain shortness of breath." Patient's past medical history and cardiovascular risk factors include: Coronary artery calcification, hyperlipidemia, former smoker, advanced age.  He is referred to the office at the request of Reynold Bowen, MD for evaluation of general adult examination, coronary artery calcification, throat tightness.  Very pleasant gentleman who is very active for his age and plays soccer once a week.  Recently started noting discomfort in the bilateral anterior chest wall around the midclavicular line which radiates to the substernal region while playing soccer.  Patient describes it as chest tightness and throat tightness which causes him to take a short break for about 10 minutes and his symptoms improved but did not completely resolve.  Because of the recurrent symptoms he is concerned that he may have underlying CAD and is referred to cardiology for further evaluation.  Associated symptoms include feeling more tired, fatigued, decreased physical endurance, and dyspnea on exertion.  He is not taking any medications and no recent cardiovascular work-up performed. His last GXT was approximately 7 years ago and was reported to be normal, per patient.  Review of his electronic medical record notes that he had a CT scan to evaluate for pulmonary embolism back in 2013 which noted scattered coronary  artery calcification.  He is currently on aspirin 81 mg p.o. daily and takes red yeast rice.  Denies prior history of myocardial infarction, congestive heart failure, deep venous thrombosis, pulmonary embolism, stroke, transient ischemic attack.  FUNCTIONAL STATUS: Plays soccer once a week.    ALLERGIES: No Known Allergies  MEDICATION LIST PRIOR TO VISIT: Current Meds  Medication Sig  . acetaminophen (TYLENOL) 500 MG tablet Take 500 mg by mouth every 6 (six) hours as needed (pain).   . Ascorbic Acid (VITAMIN C PO) Take 1 tablet by mouth every other day. Alternate with vitamin b complex  . aspirin 81 MG tablet Take 81 mg by mouth daily.  . B Complex-C (B-COMPLEX WITH VITAMIN C) tablet Take 1 tablet by mouth every other day. Alternate with vitamin c  . Biotin 10000 MCG TABS Take by mouth.  . Cholecalciferol (D-3-5) 125 MCG (5000 UT) capsule Take 5,000 Units by mouth daily.  Marland Kitchen co-enzyme Q-10 30 MG capsule Take 30 mg by mouth 3 (three) times daily.  Marland Kitchen ketoconazole (NIZORAL) 2 % cream Apply 1 application topically 2 (two) times daily.  Marland Kitchen loratadine (CLARITIN) 10 MG tablet Take 10 mg by mouth daily.  Marland Kitchen loratadine-pseudoephedrine (CLARITIN-D 12-HOUR) 5-120 MG tablet Take 1 tablet by mouth 2 (two) times daily.  . Multiple Vitamin (MULITIVITAMIN WITH MINERALS) TABS Take 1 tablet by mouth daily.  . Red Yeast Rice Extract (RED YEAST RICE PO) Take 1 tablet by mouth 2 (two) times daily.  . tamsulosin (FLOMAX) 0.4 MG CAPS capsule Take 0.4 mg by mouth every evening.      PAST MEDICAL HISTORY: Past Medical History:  Diagnosis  Date  . Aplastic anemia ([place])   . Arthritis   . Diverticulosis   . History of blood transfusion   . Hyperlipidemia   . 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 Prostate nodule - left lobe 08/29/2013   DRE 08/29/2013     PAST SURGICAL HISTORY: Past Surgical History:  Procedure Laterality Date  . APPENDECTOMY    . COLONOSCOPY    . JOINT REPLACEMENT    .  KNEE SURGERY Bilateral    knee replacement surg/[facility]il  . SHOULDER ARTHROSCOPY Left    left shoulder /replacement surg  . TONSILLECTOMY    . TOTAL HIP ARTHROPLASTY Bilateral    Bil  . TOTAL SHOULDER ARTHROPLASTY Right 09/12/2014   Procedure: RIGHT TOTAL SHOULDER ARTHROPLASTY;  Surgeon: Marin Shutter, MD;  Location: [place];  Service: Orthopedics;  Laterality: Right;    FAMILY HISTORY: The patient family history includes Cancer in his father; Heart disease in his maternal aunt and maternal uncle; Myasthenia gravis in his mother.  SOCIAL HISTORY:  The patient  reports that he quit smoking about 48 years ago. He quit after 10.00 years of use. He has never used smokeless tobacco. He reports that he does not drink alcohol and does not use drugs.  REVIEW OF SYSTEMS: Review of Systems  Constitutional: Negative for chills and fever.  HENT: Negative for hoarse voice and nosebleeds.   Eyes: Negative for discharge, double vision and pain.  Cardiovascular: Positive for chest pain (tightness) and dyspnea on exertion. Negative for claudication, leg swelling, near-syncope, orthopnea, palpitations, paroxysmal nocturnal dyspnea and syncope.  Respiratory: Negative for hemoptysis and shortness of breath.   Musculoskeletal: Negative for muscle cramps and myalgias.  Gastrointestinal: Negative for abdominal pain, constipation, diarrhea, hematemesis, hematochezia, melena, nausea and vomiting.  Neurological: Positive for dizziness and light-headedness.    PHYSICAL EXAM: Vitals with BMI 07/31/2020 05/16/2018 04/28/2016  Height $Remov'5\' 8"'uAnksA$  - $'5\' 8"'M$   Weight 166 lbs - 168 lbs  BMI 54.09 - 81.1  Systolic 914 782 956  Diastolic 74 64 72  Pulse 72 66 80   CONSTITUTIONAL: Well-developed and well-nourished. No acute distress.  SKIN: Skin is warm and dry. No rash noted. No cyanosis. No pallor. No jaundice HEAD: Normocephalic and atraumatic.  EYES: No scleral icterus MOUTH/THROAT: Moist oral membranes.  NECK: No JVD  present. No thyromegaly noted. No carotid bruits  LYMPHATIC: No visible cervical adenopathy.  CHEST Normal respiratory effort. No intercostal retractions  LUNGS: Clear to auscultation bilaterally. No stridor. No wheezes. No rales.  CARDIOVASCULAR: Regular rate and rhythm, positive S1-S2, no murmurs rubs or gallops appreciated. ABDOMINAL: No apparent ascites.  EXTREMITIES: No peripheral edema  HEMATOLOGIC: No significant bruising NEUROLOGIC: Oriented to person, place, and time. Nonfocal. Normal muscle tone.  PSYCHIATRIC: Normal mood and affect. Normal behavior. Cooperative  CTA chest PE w/ or w/o contrast:  12/08/2011: Scattered coronary artery calcifications seen.   CARDIAC DATABASE: EKG: 07/31/2020: Normal sinus rhythm, 75 bpm, left axis deviation, left anterior fascicular block, LVH per voltage criteria.  Echocardiogram: 01/18/2018: Left ventricle: The cavity size was normal. Wall thickness was increased in a pattern of mild LVH. Systolic function was normal.  The estimated ejection fraction was in the range of 60% to 65%.  Wall motion was normal; there were no regional wall motion abnormalities. Doppler parameters are consistent with abnormal left ventricular relaxation (grade 1 diastolic dysfunction). The E/e&' ratio is <8, suggesting normal LV filling pressure.  - Left atrium: The atrium was normal in size.  -  Inferior vena cava: The vessel was normal in size. The respirophasic diameter changes were in the normal range (>= 50%), consistent with normal central venous pressure.    Stress Testing: No results found for this or any previous visit from the past 1095 days.  Heart Catheterization: None  LABORATORY DATA: External Labs: Collected: 07/14/2020 Creatinine 1mg /dL. eGFR: 72.3 mL/min per 1.73 m Lipid profile: Total cholesterol 190, triglycerides 80, HDL 45, LDL 129, non-HDL145 Hemoglobin A1c: 5  IMPRESSION:    ICD-10-CM   1. Angina pectoris (HCC)  I20.9 EKG 12-Lead     nitroGLYCERIN (NITROSTAT) 0.4 MG SL tablet    CT CORONARY MORPH W/CTA COR W/SCORE W/CA W/CM &/OR WO/CM    CT CORONARY FRACTIONAL FLOW RESERVE DATA PREP    CT CORONARY FRACTIONAL FLOW RESERVE FLUID ANALYSIS    metoprolol tartrate (LOPRESSOR) 25 MG tablet  2. Dyspnea on exertion  R06.09 CT CORONARY MORPH W/CTA COR W/SCORE W/CA W/CM &/OR WO/CM    CT CORONARY FRACTIONAL FLOW RESERVE DATA PREP    CT CORONARY FRACTIONAL FLOW RESERVE FLUID ANALYSIS  3. Coronary atherosclerosis due to calcified coronary lesion  I25.10 EKG 12-Lead   I25.84 atorvastatin (LIPITOR) 20 MG tablet  4. Mixed hyperlipidemia  E78.2   5. Former smoker  Z87.891   78. Precordial pain  R07.2 CT CORONARY MORPH W/CTA COR W/SCORE W/CA W/CM &/OR WO/CM    CT CORONARY FRACTIONAL FLOW RESERVE DATA PREP    CT CORONARY FRACTIONAL FLOW RESERVE FLUID ANALYSIS     RECOMMENDATIONS: Gjon Letarte is a 79 y.o. male whose past medical history and cardiac risk factors include: Coronary artery calcification, hyperlipidemia, former smoker, advanced age.  Angina pectoris:  Patient symptoms are consistent with angina pectoris as discussed above.  EKG shows normal sinus rhythm without underlying ischemia or injury pattern.  Patient has undergone a GXT in the past which was reported to be unremarkable and given his excellent functional capacity for age as he continues to play soccer the shared decision decision was to proceed with coronary CTA with possible labs CTFFR  to evaluate for obstructive CAD.  Lopressor 25 mg p.o. twice daily starting 2 days prior to the study.  Sublingual nitroglycerin tablets to use on as needed basis.  Patient verbalizes understanding that he cannot concomitantly use erectile dysfunction medications or BPH medications that fall into phosphodiesterase 5 inhibitor (i.e. sildenafil/Viagra, sildenafil/Cialis, vardenafil/Levitra) as there are drug drug interactions which may lead to worsening morbidity mortality and even  death.  Patient is informed to seek medical attention sooner by going to the closest hospital via EMS if the symptoms increase in intensity, frequency, and/or duration.  Dyspnea on exertion: See above  Coronary artery calcification:  Continue aspirin 81 mg p.o. daily.  We will start Lipitor 20 mg p.o. nightly.  Patient recently had a CMP performed at PCPs office early October 2021.  AST and ALT within normal limits  Mixed hyperlipidemia: Start statin therapy.  Patient is asked to hold off on using red yeast rice for now.  FINAL MEDICATION LIST END OF ENCOUNTER: Meds ordered this encounter  Medications  . nitroGLYCERIN (NITROSTAT) 0.4 MG SL tablet    Sig: Place 1 tablet (0.4 mg total) under the tongue every 5 (five) minutes as needed for chest pain. If you require more than two tablets five minutes apart go to the nearest ER via EMS.    Dispense:  30 tablet    Refill:  0  . atorvastatin (LIPITOR) 20 MG tablet  Sig: Take 1 tablet (20 mg total) by mouth at bedtime.    Dispense:  90 tablet    Refill:  0  . metoprolol tartrate (LOPRESSOR) 25 MG tablet    Sig: Take 1 tablet (25 mg total) by mouth 2 (two) times daily for 10 days.    Dispense:  20 tablet    Refill:  0    Medications Discontinued During This Encounter  Medication Reason  . nitroGLYCERIN (NITROSTAT) 0.4 MG SL tablet      Current Outpatient Medications:  .  acetaminophen (TYLENOL) 500 MG tablet, Take 500 mg by mouth every 6 (six) hours as needed (pain). , Disp: , Rfl:  .  Ascorbic Acid (VITAMIN C PO), Take 1 tablet by mouth every other day. Alternate with vitamin b complex, Disp: , Rfl:  .  aspirin 81 MG tablet, Take 81 mg by mouth daily., Disp: , Rfl:  .  B Complex-C (B-COMPLEX WITH VITAMIN C) tablet, Take 1 tablet by mouth every other day. Alternate with vitamin c, Disp: , Rfl:  .  Biotin 10000 MCG TABS, Take by mouth., Disp: , Rfl:  .  Cholecalciferol (D-3-5) 125 MCG (5000 UT) capsule, Take 5,000 Units by mouth  daily., Disp: , Rfl:  .  co-enzyme Q-10 30 MG capsule, Take 30 mg by mouth 3 (three) times daily., Disp: , Rfl:  .  ketoconazole (NIZORAL) 2 % cream, Apply 1 application topically 2 (two) times daily., Disp: 15 g, Rfl: 2 .  loratadine (CLARITIN) 10 MG tablet, Take 10 mg by mouth daily., Disp: , Rfl:  .  loratadine-pseudoephedrine (CLARITIN-D 12-HOUR) 5-120 MG tablet, Take 1 tablet by mouth 2 (two) times daily., Disp: , Rfl:  .  Multiple Vitamin (MULITIVITAMIN WITH MINERALS) TABS, Take 1 tablet by mouth daily., Disp: , Rfl:  .  Red Yeast Rice Extract (RED YEAST RICE PO), Take 1 tablet by mouth 2 (two) times daily., Disp: , Rfl:  .  tamsulosin (FLOMAX) 0.4 MG CAPS capsule, Take 0.4 mg by mouth every evening. , Disp: , Rfl:  .  atorvastatin (LIPITOR) 20 MG tablet, Take 1 tablet (20 mg total) by mouth at bedtime., Disp: 90 tablet, Rfl: 0 .  metoprolol tartrate (LOPRESSOR) 25 MG tablet, Take 1 tablet (25 mg total) by mouth 2 (two) times daily for 10 days., Disp: 20 tablet, Rfl: 0 .  nitroGLYCERIN (NITROSTAT) 0.4 MG SL tablet, Place 1 tablet (0.4 mg total) under the tongue every 5 (five) minutes as needed for chest pain. If you require more than two tablets five minutes apart go to the nearest ER via EMS., Disp: 30 tablet, Rfl: 0  Orders Placed This Encounter  Procedures  . CT CORONARY MORPH W/CTA COR W/SCORE W/CA W/CM &/OR WO/CM  . CT CORONARY FRACTIONAL FLOW RESERVE DATA PREP  . CT CORONARY FRACTIONAL FLOW RESERVE FLUID ANALYSIS  . EKG 12-Lead    There are no Patient Instructions on file for this visit.   --Continue cardiac medications as reconciled in final medication list. --Return in about 4 weeks (around 08/28/2020) for Reevaluation of, Dyspnea, Chest pain, Review test results. Or sooner if needed. --Continue follow-up with your primary care physician regarding the management of your other chronic comorbid conditions.  Patient's questions and concerns were addressed to his satisfaction. He  voices understanding of the instructions provided during this encounter.   This note was created using a voice recognition software as a result there may be grammatical errors inadvertently enclosed that do not reflect the nature  of this encounter. Every attempt is made to correct such errors.  Rex Kras, [place], [facility]  Pager: [phone] Office: [phone]

## 2020-08-07 ENCOUNTER — Other Ambulatory Visit: Payer: Self-pay | Admitting: Cardiology

## 2020-08-07 DIAGNOSIS — I209 Angina pectoris, unspecified: Secondary | ICD-10-CM

## 2020-08-09 ENCOUNTER — Other Ambulatory Visit: Payer: Self-pay | Admitting: Cardiology

## 2020-08-09 DIAGNOSIS — I209 Angina pectoris, unspecified: Secondary | ICD-10-CM

## 2020-08-11 ENCOUNTER — Telehealth (HOSPITAL_COMMUNITY): Payer: Self-pay | Admitting: Emergency Medicine

## 2020-08-11 NOTE — Telephone Encounter (Signed)
Attempted to call patient regarding upcoming cardiac CT appointment. °Left message on voicemail with name and callback number °Xylia Scherger RN Navigator Cardiac Imaging °[facility] °[phone] Office °[phone] Cell ° °

## 2020-08-12 ENCOUNTER — Telehealth (HOSPITAL_COMMUNITY): Payer: Self-pay | Admitting: Emergency Medicine

## 2020-08-12 NOTE — Telephone Encounter (Signed)
Pt returning phone call regarding upcoming cardiac imaging study; pt verbalizes understanding of appt date/time, parking situation and where to check in, pre-test NPO status and medications ordered, and verified current allergies; name and call back number provided for further questions should they arise Vincent Alexandria RN Navigator Cardiac Imaging [facility] [phone] office [phone] cell  Pt given metoprolol BID x 10 days in prep for CCTA. Pt to take last dose 2 hr prior to scan.

## 2020-08-13 ENCOUNTER — Ambulatory Visit (HOSPITAL_COMMUNITY)
Admission: RE | Admit: 2020-08-13 | Discharge: 2020-08-13 | Disposition: A | Discharge status: Home / Self Care | Payer: Medicare Other | Source: Ambulatory Visit | Attending: Cardiology | Admitting: Cardiology

## 2020-08-13 ENCOUNTER — Encounter: Payer: Self-pay | Admitting: *Deleted

## 2020-08-13 ENCOUNTER — Encounter (HOSPITAL_COMMUNITY): Payer: Self-pay

## 2020-08-13 ENCOUNTER — Other Ambulatory Visit: Payer: Self-pay

## 2020-08-13 DIAGNOSIS — R072 Precordial pain: Secondary | ICD-10-CM | POA: Diagnosis not present

## 2020-08-13 DIAGNOSIS — I209 Angina pectoris, unspecified: Secondary | ICD-10-CM | POA: Insufficient documentation

## 2020-08-13 DIAGNOSIS — I700 Atherosclerosis of aorta: Secondary | ICD-10-CM | POA: Insufficient documentation

## 2020-08-13 DIAGNOSIS — R0600 Dyspnea, unspecified: Secondary | ICD-10-CM | POA: Insufficient documentation

## 2020-08-13 DIAGNOSIS — I2510 Atherosclerotic heart disease of native coronary artery without angina pectoris: Secondary | ICD-10-CM | POA: Diagnosis not present

## 2020-08-13 DIAGNOSIS — R0609 Other forms of dyspnea: Secondary | ICD-10-CM

## 2020-08-13 DIAGNOSIS — Z006 Encounter for examination for normal comparison and control in clinical research program: Secondary | ICD-10-CM

## 2020-08-13 LAB — POCT I-STAT CREATININE: Creatinine, Ser: 0.8 mg/dL (ref 0.61–1.24)

## 2020-08-13 MED ORDER — NITROGLYCERIN 0.4 MG SL SUBL
0.8000 mg | SUBLINGUAL_TABLET | Freq: Once | SUBLINGUAL | Status: AC
Start: 2020-08-13 — End: 2020-08-13
  Administered 2020-08-13: 0.8 mg via SUBLINGUAL

## 2020-08-13 MED ORDER — IOHEXOL 350 MG/ML SOLN
100.0000 mL | Freq: Once | INTRAVENOUS | Status: AC | PRN
  Administered 2020-08-13: 100 mL via INTRAVENOUS

## 2020-08-13 MED ORDER — NITROGLYCERIN 0.4 MG SL SUBL
SUBLINGUAL_TABLET | SUBLINGUAL | Status: AC
  Filled 2020-08-13: qty 2

## 2020-08-13 NOTE — Research (Signed)
IDENTIFY  Informed Consent                  Subject Name:   Vincent Jensen   Subject met inclusion and exclusion criteria.  The informed consent form, study requirements and expectations were reviewed with the subject and questions and concerns were addressed prior to the signing of the consent form.  The subject verbalized understanding of the trial requirements.  The subject agreed to participate in the IDENTIFY  trial and signed the informed consent.  The informed consent was obtained prior to performance of any protocol-specific procedures for the subject.  A copy of the signed informed consent was given to the subject and a copy was placed in the subject's medical record.   [place] Kimara Bencomo, Research Assistant  08/13/2020  06:50 a.m.

## 2020-08-21 ENCOUNTER — Ambulatory Visit: Payer: Medicare Other | Admitting: Cardiology

## 2020-08-25 ENCOUNTER — Encounter: Payer: Self-pay | Admitting: Cardiology

## 2020-08-25 ENCOUNTER — Ambulatory Visit: Payer: Medicare Other | Admitting: Cardiology

## 2020-08-25 ENCOUNTER — Other Ambulatory Visit: Payer: Self-pay

## 2020-08-25 VITALS — BP 117/76 | HR 83 | Resp 16 | Ht 68.0 in | Wt 166.0 lb

## 2020-08-25 DIAGNOSIS — Z87891 Personal history of nicotine dependence: Secondary | ICD-10-CM

## 2020-08-25 DIAGNOSIS — E782 Mixed hyperlipidemia: Secondary | ICD-10-CM

## 2020-08-25 DIAGNOSIS — I2510 Atherosclerotic heart disease of native coronary artery without angina pectoris: Secondary | ICD-10-CM

## 2020-08-25 DIAGNOSIS — R5383 Other fatigue: Secondary | ICD-10-CM | POA: Diagnosis not present

## 2020-08-25 DIAGNOSIS — I2584 Coronary atherosclerosis due to calcified coronary lesion: Secondary | ICD-10-CM

## 2020-08-25 NOTE — Progress Notes (Signed)
Date:  08/25/2020   ID:  [name], DOB 1941-04-30, MRN [id number]  PCP:  Reynold Bowen, MD  Cardiologist:  Rex Kras, DO, [facility] (established care 07/31/2020)  Date: 08/25/20 Last Office Visit: 07/31/2020  Chief Complaint  Patient presents with  . Angina pectoris ([place])  . Follow-up  . Results    HPI  Vincent Jensen is a 79 y.o. male who presents to the office with a chief complaint of " reevaluation of angina pectoris and review test results." Patient's past medical history and cardiovascular risk factors include: Moderate coronary artery calcification, nonobstructive CAD per coronary CT FFR, hyperlipidemia, former smoker, advanced age.  He is referred to the office at the request of Reynold Bowen, MD for evaluation of general adult examination, coronary artery calcification, throat tightness.  Patient presented to the office back in October 2021 with symptoms concerning for angina pectoris.  He had undergone a GXT approximately 7 years ago and was noted to be normal.  He also had nongated CT scan which noted scattered coronary artery calcification and was referred to cardiology for further evaluation and management.  Given his symptoms patient was started on low-dose beta-blocker therapy, given sublingual nitroglycerin tablets to use on a as needed basis, and statin therapy.  He was also recommended to undergo coronary CTA with possible FFR.  Since last office visit patient states that he is doing well from a cardiovascular standpoint.  He has not had reoccurrence of cough related chest pain or shortness of breath this is most likely secondary to him not being as active and taking it easy until the evaluation is complete.  He does feel more tired and fatigue and states that he is feeling that he is sleeping for 9 to 10 hours a day, easily falls asleep, snores at night.  He has not required the use of sublingual nitroglycerin tablets since last office visit.  He is doing  well on beta-blocker therapy as well as statin therapy.  Reviewed the coronary CTA results with both the patient and his daughter Vincent Jensen at today's encounter.  Patient is noted to have moderate coronary artery calcification and nonobstructive CAD with CT FFR being negative for any hemodynamically significant stenosis.  FUNCTIONAL STATUS: Plays soccer once a week.    ALLERGIES: No Known Allergies  MEDICATION LIST PRIOR TO VISIT: Current Meds  Medication Sig  . acetaminophen (TYLENOL) 500 MG tablet Take 500 mg by mouth every 6 (six) hours as needed (pain).   . Ascorbic Acid (VITAMIN C PO) Take 1 tablet by mouth every other day. Alternate with vitamin b complex  . aspirin 81 MG tablet Take 81 mg by mouth daily.  Marland Kitchen atorvastatin (LIPITOR) 20 MG tablet Take 1 tablet (20 mg total) by mouth at bedtime.  . B Complex-C (B-COMPLEX WITH VITAMIN C) tablet Take 1 tablet by mouth every other day. Alternate with vitamin c  . Biotin 10000 MCG TABS Take by mouth.  . Cholecalciferol (D-3-5) 125 MCG (5000 UT) capsule Take 5,000 Units by mouth daily.  Marland Kitchen co-enzyme Q-10 30 MG capsule Take 30 mg by mouth 3 (three) times daily.  Marland Kitchen ketoconazole (NIZORAL) 2 % cream Apply 1 application topically 2 (two) times daily.  Marland Kitchen loratadine (CLARITIN) 10 MG tablet Take 10 mg by mouth daily.  Marland Kitchen loratadine-pseudoephedrine (CLARITIN-D 12-HOUR) 5-120 MG tablet Take 1 tablet by mouth 2 (two) times daily.  . metoprolol tartrate (LOPRESSOR) 25 MG tablet TAKE 1 TABLET(25 MG) BY MOUTH TWICE DAILY FOR 10 DAYS  .  Multiple Vitamin (MULITIVITAMIN WITH MINERALS) TABS Take 1 tablet by mouth daily.  . nitroGLYCERIN (NITROSTAT) 0.4 MG SL tablet PLACE 1 TABLET UNDER THE TONGUE EVERY 5 MINGUTES AS NEEDED FOR CHEST PAIN. AFTER 2 TABLETS FIVE MINUTES APART GO TO NEAREST ER VIA EMS.  . tamsulosin (FLOMAX) 0.4 MG CAPS capsule Take 0.4 mg by mouth every evening.      PAST MEDICAL HISTORY: Past Medical History:  Diagnosis Date  . Aplastic anemia  ([place])   . Arthritis   . Diverticulosis   . History of blood transfusion   . Hyperlipidemia   . 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 Prostate nodule - left lobe 08/29/2013   DRE 08/29/2013     PAST SURGICAL HISTORY: Past Surgical History:  Procedure Laterality Date  . APPENDECTOMY    . COLONOSCOPY    . JOINT REPLACEMENT    . KNEE SURGERY Bilateral    knee replacement surg/Bil  . SHOULDER ARTHROSCOPY Left    left shoulder /replacement surg  . TONSILLECTOMY    . TOTAL HIP ARTHROPLASTY Bilateral    Bil  . TOTAL SHOULDER ARTHROPLASTY Right 09/12/2014   Procedure: RIGHT TOTAL SHOULDER ARTHROPLASTY;  Surgeon: Marin Shutter, MD;  Location: [facility];  Service: Orthopedics;  Laterality: Right;    FAMILY HISTORY: The patient family history includes Cancer in his father; Heart disease in his maternal aunt and maternal uncle; Myasthenia gravis in his mother.  SOCIAL HISTORY:  The patient  reports that he quit smoking about 48 years ago. He quit after 10.00 years of use. He has never used smokeless tobacco. He reports that he does not drink alcohol and does not use drugs.  REVIEW OF SYSTEMS: Review of Systems  Constitutional: Positive for malaise/fatigue. Negative for chills and fever.  HENT: Negative for hoarse voice and nosebleeds.   Eyes: Negative for discharge, double vision and pain.  Cardiovascular: Negative for chest pain, claudication, dyspnea on exertion, leg swelling, near-syncope, orthopnea, palpitations, paroxysmal nocturnal dyspnea and syncope.  Respiratory: Positive for snoring. Negative for hemoptysis and shortness of breath.   Musculoskeletal: Negative for muscle cramps and myalgias.  Gastrointestinal: Negative for abdominal pain, constipation, diarrhea, hematemesis, hematochezia, melena, nausea and vomiting.  Neurological: Negative for dizziness and light-headedness.    PHYSICAL EXAM: Vitals with BMI 08/25/2020 08/13/2020 08/13/2020  Height _0  - -   Weight 166 lbs - -  BMI 34.91 - -  Systolic 791 90 90  Diastolic 76 61 50  Pulse 83 - 52   CONSTITUTIONAL: Well-developed and well-nourished. No acute distress.  SKIN: Skin is warm and dry. No rash noted. No cyanosis. No pallor. No jaundice HEAD: Normocephalic and atraumatic.  EYES: No scleral icterus MOUTH/THROAT: Moist oral membranes.  NECK: No JVD present. No thyromegaly noted. No carotid bruits  LYMPHATIC: No visible cervical adenopathy.  CHEST Normal respiratory effort. No intercostal retractions  LUNGS: Clear to auscultation bilaterally. No stridor. No wheezes. No rales.  CARDIOVASCULAR: Regular rate and rhythm, positive S1-S2, no murmurs rubs or gallops appreciated. ABDOMINAL: No apparent ascites.  EXTREMITIES: No peripheral edema  HEMATOLOGIC: No significant bruising NEUROLOGIC: Oriented to person, place, and time. Nonfocal. Normal muscle tone.  PSYCHIATRIC: Normal mood and affect. Normal behavior. Cooperative  CTA chest PE w/ or w/o contrast:  12/08/2011: Scattered coronary artery calcifications seen.   CARDIAC DATABASE: EKG: 07/31/2020: Normal sinus rhythm, 75 bpm, left axis deviation, left anterior fascicular block, LVH per voltage criteria. 08/25/2020: Normal sinus rhythm, 76 bpm, left axis deviation, Nonspecific  QRS widening, left anterior fascicular block, without underlying injury pattern.  Echocardiogram: 01/18/2018: Left ventricle: The cavity size was normal. Wall thickness was increased in a pattern of mild LVH. Systolic function was normal.  The estimated ejection fraction was in the range of 60% to 65%.  Wall motion was normal; there were no regional wall motion abnormalities. Doppler parameters are consistent with abnormal left ventricular relaxation (grade 1 diastolic dysfunction). The E/e&' ratio is <8, suggesting normal LV filling pressure.  - Left atrium: The atrium was normal in size.  - Inferior vena cava: The vessel was normal in size. The  respirophasic diameter changes were in the normal range (>= 50%), consistent with normal central venous pressure.    Stress Testing: No results found for this or any previous visit from the past 1095 days.  08/13/2020 CCTA:  Coronary calcium score of 363 AU. This was 52th percentile for age and sex matched control.  Normal coronary origin with right dominance.  CAD-RADS = 3. CT Mild stenosis in the proximal to mid LAD, Moderate stenosis is first diagonal branch, Mild stenosis in mid LCX, Moderate stenosis in mid RCA.  Mid ascending aorta measures 64mm, upper limit of normal, at the level of the PA bifurcation.  Mild Aortic atherosclerosis.  CT FFR analysis showed no significant stenosis.  RECOMMENDATIONS: CAD-RADS 3: Moderate stenosis. Consider symptom-guided anti-ischemic pharmacotherapy as well as risk factor modification per guideline directed care.  Heart Catheterization: None  LABORATORY DATA: External Labs: Collected: 07/14/2020 Creatinine 1mg /dL. eGFR: 72.3 mL/min per 1.73 m Lipid profile: Total cholesterol 190, triglycerides 80, HDL 45, LDL 129, non-HDL145 Hemoglobin A1c: 5  IMPRESSION:    ICD-10-CM   1. Nonobstructive atherosclerosis of coronary artery  I25.10 EKG 12-Lead    PCV ECHOCARDIOGRAM COMPLETE  2. Coronary atherosclerosis due to calcified coronary lesion  I25.10 PCV ECHOCARDIOGRAM COMPLETE   I25.84   3. Mixed hyperlipidemia  E78.2   4. Former smoker  Z87.891   5. Tiredness  R53.83      RECOMMENDATIONS: Atha Dhanani is a 79 y.o. male whose past medical history and cardiac risk factors include: Moderate coronary artery calcification, nonobstructive CAD per coronary CT FFR, hyperlipidemia, former smoker, advanced age.  Nonobstructive CAD:  Recent coronary CTA results patient has underlying nonobstructive CAD with moderate coronary artery calcification.  CT FFR did not illustrate hemodynamically significant stenosis.  Patient symptoms have also improved  since last office visit.  He has not required the use of sublingual nitroglycerin tablets.  He has tolerated the initiation of Lopressor, statin therapy well without any side effects or intolerances.  Given the continued feeling of being tired and fatigue we will check an echocardiogram to reevaluate his LVEF in the setting of nonobstructive CAD.  Educated on importance of continued lifestyle modifications.  Plan of care discussed with both the patient and his daughter Vincent Jensen at today's encounter.  They agree with the plan of care as discussed above.  Would like to see him back in close follow-up in 3 months to see how he is doing.  Patient is informed to seek medical attention sooner by going to the closest hospital via EMS if the symptoms increase in intensity, frequency, and/or duration.  Generalized tired fatigue: Most likely secondary to noncardiac cause. We will repeat echocardiogram to reevaluate LVEF. Recommend follow-up with PCP and considering checking TSH, B12, vitamin D levels, and other non cardiac causes of generalized tired and fatigue  Coronary artery calcification:  Continue aspirin 81 mg p.o. daily.  Continue  Lipitor 20 mg p.o. nightly.  Patient recently had a CMP performed at PCPs office early October 2021.  AST and ALT within normal limits  Mixed hyperlipidemia: Continue Lipitor 20 mg p.o. nightly.  FINAL MEDICATION LIST END OF ENCOUNTER: No orders of the defined types were placed in this encounter.    Current Outpatient Medications:  .  acetaminophen (TYLENOL) 500 MG tablet, Take 500 mg by mouth every 6 (six) hours as needed (pain). , Disp: , Rfl:  .  Ascorbic Acid (VITAMIN C PO), Take 1 tablet by mouth every other day. Alternate with vitamin b complex, Disp: , Rfl:  .  aspirin 81 MG tablet, Take 81 mg by mouth daily., Disp: , Rfl:  .  atorvastatin (LIPITOR) 20 MG tablet, Take 1 tablet (20 mg total) by mouth at bedtime., Disp: 90 tablet, Rfl: 0 .  B Complex-C  (B-COMPLEX WITH VITAMIN C) tablet, Take 1 tablet by mouth every other day. Alternate with vitamin c, Disp: , Rfl:  .  Biotin 10000 MCG TABS, Take by mouth., Disp: , Rfl:  .  Cholecalciferol (D-3-5) 125 MCG (5000 UT) capsule, Take 5,000 Units by mouth daily., Disp: , Rfl:  .  co-enzyme Q-10 30 MG capsule, Take 30 mg by mouth 3 (three) times daily., Disp: , Rfl:  .  ketoconazole (NIZORAL) 2 % cream, Apply 1 application topically 2 (two) times daily., Disp: 15 g, Rfl: 2 .  loratadine (CLARITIN) 10 MG tablet, Take 10 mg by mouth daily., Disp: , Rfl:  .  loratadine-pseudoephedrine (CLARITIN-D 12-HOUR) 5-120 MG tablet, Take 1 tablet by mouth 2 (two) times daily., Disp: , Rfl:  .  metoprolol tartrate (LOPRESSOR) 25 MG tablet, TAKE 1 TABLET(25 MG) BY MOUTH TWICE DAILY FOR 10 DAYS, Disp: 20 tablet, Rfl: 0 .  Multiple Vitamin (MULITIVITAMIN WITH MINERALS) TABS, Take 1 tablet by mouth daily., Disp: , Rfl:  .  nitroGLYCERIN (NITROSTAT) 0.4 MG SL tablet, PLACE 1 TABLET UNDER THE TONGUE EVERY 5 MINGUTES AS NEEDED FOR CHEST PAIN. AFTER 2 TABLETS FIVE MINUTES APART GO TO NEAREST ER VIA EMS., Disp: 25 tablet, Rfl: 1 .  tamsulosin (FLOMAX) 0.4 MG CAPS capsule, Take 0.4 mg by mouth every evening. , Disp: , Rfl:   Orders Placed This Encounter  Procedures  . EKG 12-Lead  . PCV ECHOCARDIOGRAM COMPLETE    There are no Patient Instructions on file for this visit.   --Continue cardiac medications as reconciled in final medication list. --Return for Follow up to re-evaluate CP or Dyspnea. . Or sooner if needed. --Continue follow-up with your primary care physician regarding the management of your other chronic comorbid conditions.  Patient's questions and concerns were addressed to his satisfaction. He voices understanding of the instructions provided during this encounter.   This note was created using a voice recognition software as a result there may be grammatical errors inadvertently enclosed that do not  reflect the nature of this encounter. Every attempt is made to correct such errors.  Rex Kras, [place], [facility]  Pager: [phone] Office: [phone]

## 2020-08-28 ENCOUNTER — Ambulatory Visit: Payer: Medicare Other | Admitting: Cardiology

## 2020-09-02 DIAGNOSIS — Z1212 Encounter for screening for malignant neoplasm of rectum: Secondary | ICD-10-CM | POA: Diagnosis not present

## 2020-10-15 NOTE — Progress Notes (Signed)
Patient didn't answer left a vm

## 2020-10-28 ENCOUNTER — Other Ambulatory Visit: Payer: Self-pay | Admitting: Cardiology

## 2020-10-28 DIAGNOSIS — I2510 Atherosclerotic heart disease of native coronary artery without angina pectoris: Secondary | ICD-10-CM

## 2020-11-19 ENCOUNTER — Other Ambulatory Visit: Payer: Self-pay

## 2020-11-19 ENCOUNTER — Ambulatory Visit: Payer: Medicare Other

## 2020-11-19 DIAGNOSIS — I2510 Atherosclerotic heart disease of native coronary artery without angina pectoris: Secondary | ICD-10-CM

## 2020-11-19 DIAGNOSIS — I2584 Coronary atherosclerosis due to calcified coronary lesion: Secondary | ICD-10-CM | POA: Diagnosis not present

## 2020-11-26 ENCOUNTER — Ambulatory Visit: Payer: Medicare Other | Admitting: Cardiology

## 2020-11-26 ENCOUNTER — Other Ambulatory Visit: Payer: Self-pay

## 2020-11-26 ENCOUNTER — Encounter: Payer: Self-pay | Admitting: Cardiology

## 2020-11-26 VITALS — BP 120/68 | HR 67 | Temp 97.5°F | Resp 16 | Ht 68.0 in | Wt 167.0 lb

## 2020-11-26 DIAGNOSIS — I2584 Coronary atherosclerosis due to calcified coronary lesion: Secondary | ICD-10-CM | POA: Diagnosis not present

## 2020-11-26 DIAGNOSIS — E782 Mixed hyperlipidemia: Secondary | ICD-10-CM | POA: Diagnosis not present

## 2020-11-26 DIAGNOSIS — D619 Aplastic anemia, unspecified: Secondary | ICD-10-CM | POA: Diagnosis not present

## 2020-11-26 DIAGNOSIS — R5383 Other fatigue: Secondary | ICD-10-CM | POA: Diagnosis not present

## 2020-11-26 DIAGNOSIS — E559 Vitamin D deficiency, unspecified: Secondary | ICD-10-CM | POA: Diagnosis not present

## 2020-11-26 DIAGNOSIS — R7302 Impaired glucose tolerance (oral): Secondary | ICD-10-CM | POA: Diagnosis not present

## 2020-11-26 DIAGNOSIS — Z87891 Personal history of nicotine dependence: Secondary | ICD-10-CM

## 2020-11-26 DIAGNOSIS — I700 Atherosclerosis of aorta: Secondary | ICD-10-CM | POA: Diagnosis not present

## 2020-11-26 DIAGNOSIS — E785 Hyperlipidemia, unspecified: Secondary | ICD-10-CM | POA: Diagnosis not present

## 2020-11-26 DIAGNOSIS — M1990 Unspecified osteoarthritis, unspecified site: Secondary | ICD-10-CM | POA: Diagnosis not present

## 2020-11-26 DIAGNOSIS — I2510 Atherosclerotic heart disease of native coronary artery without angina pectoris: Secondary | ICD-10-CM | POA: Diagnosis not present

## 2020-11-26 DIAGNOSIS — R946 Abnormal results of thyroid function studies: Secondary | ICD-10-CM | POA: Diagnosis not present

## 2020-11-26 DIAGNOSIS — I5189 Other ill-defined heart diseases: Secondary | ICD-10-CM | POA: Diagnosis not present

## 2020-11-26 NOTE — Progress Notes (Signed)
Date:  11/26/2020   ID:  [name], DOB 02/03/1941, MRN [id number]  PCP:  Reynold Bowen, MD  Cardiologist:  Rex Kras, DO, [facility] (established care 07/31/2020)  Date: 11/26/20 Last Office Visit: 08/25/2020  Chief Complaint  Patient presents with  . Follow-up  . Nonobstructive atherosclerosis of coronary artery    HPI  Vincent Jensen is a 80 y.o. male who presents to the office with a chief complaint of " 3 month follow up for CAD." Patient's past medical history and cardiovascular risk factors include: Moderate coronary artery calcification, nonobstructive CAD per coronary CT FFR, hyperlipidemia, former smoker, advanced age.  He is referred to the office at the request of Reynold Bowen, MD for evaluation of general adult examination, coronary artery calcification, throat tightness.  Patient presents to the office accompanied by her daughter [name].  He provides verbal consent in regards to discussing his medical information in her presence.  In the past patient had a nongated CT study which noted scattered coronary artery calcification and therefore referral was referred to cardiology for further evaluation and management as he also has symptoms concerning for angina pectoris.  Since then patient has undergone a cardiovascular evaluation including a coronary CTA with FFR.  He is noted to have nonobstructive CAD and per CT FFR the blockages are not hemodynamically significant.  Patient is educated on the importance of improving his modifiable cardiovascular risk factors.  He is currently on aspirin and statin therapy and is tolerating the medications well.  He was started on beta-blocker therapy in the past.  However, he states that he continues to feel tired and fatigued.  He has not had a chance to follow-up with his PCP as recommended at the last office visit to rule out noncardiac causes of his generalized tiredness and fatigue.  Since last office visit he was noted to have  an echocardiogram which notes preserved LVEF, grade 1 diastolic impairment, mild biatrial enlargement, and mild/moderate valvular heart disease.  Patient denies any chest pain or anginal equivalent.  No use of sublingual nitroglycerin tablets since last visit.  Patient complains of feeling lightheaded and dizzy when he bends forward.  Patient's daughter states that he has had some indiscretion with his diet.  ALLERGIES: No Known Allergies  MEDICATION LIST PRIOR TO VISIT: Current Meds  Medication Sig  . acetaminophen (TYLENOL) 500 MG tablet Take 500 mg by mouth every 6 (six) hours as needed (pain).   . Ascorbic Acid (VITAMIN C PO) Take 1 tablet by mouth every other day. Alternate with vitamin b complex  . aspirin 81 MG tablet Take 81 mg by mouth daily.  Marland Kitchen atorvastatin (LIPITOR) 20 MG tablet TAKE 1 TABLET(20 MG) BY MOUTH AT BEDTIME  . B Complex-C (B-COMPLEX WITH VITAMIN C) tablet Take 1 tablet by mouth every other day. Alternate with vitamin c  . Biotin 10000 MCG TABS Take by mouth.  . Cholecalciferol (D-3-5) 125 MCG (5000 UT) capsule Take 5,000 Units by mouth daily.  Marland Kitchen co-enzyme Q-10 30 MG capsule Take 30 mg by mouth 3 (three) times daily.  Marland Kitchen loratadine (CLARITIN) 10 MG tablet Take 10 mg by mouth daily.  Marland Kitchen loratadine-pseudoephedrine (CLARITIN-D 12-HOUR) 5-120 MG tablet Take 1 tablet by mouth 2 (two) times daily.  . Multiple Vitamin (MULITIVITAMIN WITH MINERALS) TABS Take 1 tablet by mouth daily.  . nitroGLYCERIN (NITROSTAT) 0.4 MG SL tablet PLACE 1 TABLET UNDER THE TONGUE EVERY 5 MINGUTES AS NEEDED FOR CHEST PAIN. AFTER 2 TABLETS FIVE MINUTES APART GO TO  NEAREST ER VIA EMS.  . tamsulosin (FLOMAX) 0.4 MG CAPS capsule Take 0.4 mg by mouth every evening.      PAST MEDICAL HISTORY: Past Medical History:  Diagnosis Date  . Aplastic anemia ([place])   . Arthritis   . Diverticulosis   . History of blood transfusion   . Hyperlipidemia   . 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  Prostate nodule - left lobe 08/29/2013   DRE 08/29/2013     PAST SURGICAL HISTORY: Past Surgical History:  Procedure Laterality Date  . APPENDECTOMY    . COLONOSCOPY    . JOINT REPLACEMENT    . KNEE SURGERY Bilateral    knee replacement surg/Bil  . SHOULDER ARTHROSCOPY Left    left shoulder /replacement surg  . TONSILLECTOMY    . TOTAL HIP ARTHROPLASTY Bilateral    Bil  . TOTAL SHOULDER ARTHROPLASTY Right 09/12/2014   Procedure: RIGHT TOTAL SHOULDER ARTHROPLASTY;  Surgeon: Marin Shutter, MD;  Location: [facility];  Service: Orthopedics;  Laterality: Right;    FAMILY HISTORY: The patient family history includes Cancer in his father; Heart disease in his maternal aunt and maternal uncle; Myasthenia gravis in his mother.  SOCIAL HISTORY:  The patient  reports that he quit smoking about 48 years ago. He quit after 10.00 years of use. He has never used smokeless tobacco. He reports that he does not drink alcohol and does not use drugs.  REVIEW OF SYSTEMS: Review of Systems  Constitutional: Positive for malaise/fatigue. Negative for chills and fever.  HENT: Negative for hoarse voice and nosebleeds.   Eyes: Negative for discharge, double vision and pain.  Cardiovascular: Negative for chest pain, claudication, dyspnea on exertion, leg swelling, near-syncope, orthopnea, palpitations, paroxysmal nocturnal dyspnea and syncope.  Respiratory: Positive for snoring. Negative for hemoptysis and shortness of breath.   Musculoskeletal: Negative for muscle cramps and myalgias.  Gastrointestinal: Negative for abdominal pain, constipation, diarrhea, hematemesis, hematochezia, melena, nausea and vomiting.  Neurological: Negative for dizziness and light-headedness.    PHYSICAL EXAM: Vitals with BMI 11/26/2020 08/25/2020 08/13/2020  Height $Remov'5\' 8"'DqRmkN$  $Remove'5\' 8"'wQBZpjQ$  -  Weight 167 lbs 166 lbs -  BMI 94.7 09.62 -  Systolic 836 629 90  Diastolic 68 76 61  Pulse 67 83 -   CONSTITUTIONAL: Well-developed and  well-nourished. No acute distress.  SKIN: Skin is warm and dry. No rash noted. No cyanosis. No pallor. No jaundice HEAD: Normocephalic and atraumatic.  EYES: No scleral icterus MOUTH/THROAT: Moist oral membranes.  NECK: No JVD present. No thyromegaly noted. No carotid bruits  LYMPHATIC: No visible cervical adenopathy.  CHEST Normal respiratory effort. No intercostal retractions  LUNGS: Clear to auscultation bilaterally. No stridor. No wheezes. No rales.  CARDIOVASCULAR: Regular rate and rhythm, positive S1-S2, no murmurs rubs or gallops appreciated. ABDOMINAL: No apparent ascites.  EXTREMITIES: No peripheral edema  HEMATOLOGIC: No significant bruising NEUROLOGIC: Oriented to person, place, and time. Nonfocal. Normal muscle tone.  PSYCHIATRIC: Normal mood and affect. Normal behavior. Cooperative  CTA chest PE w/ or w/o contrast:  12/08/2011: Scattered coronary artery calcifications seen.   CARDIAC DATABASE: EKG: 07/31/2020: Normal sinus rhythm, 75 bpm, left axis deviation, left anterior fascicular block, LVH per voltage criteria. 08/25/2020: Normal sinus rhythm, 76 bpm, left axis deviation, Nonspecific QRS widening, left anterior fascicular block, without underlying injury pattern.  Echocardiogram: 11/19/2020: Left ventricle cavity is normal in size. Mild concentric hypertrophy of the left ventricle. Normal global wall motion. Normal LV systolic function with EF 55%. Doppler evidence  of grade I (impaired) diastolic dysfunction, normal LAP. Calculated EF 55%. Left atrial cavity is mildly dilated. Right atrial cavity is mildly dilated. Mild to moderate mitral regurgitation. Mild tricuspid regurgitation. Estimated pulmonary artery systolic pressure 31 mmHg.   Stress Testing: No results found for this or any previous visit from the past 1095 days.  08/13/2020 CCTA:  Coronary calcium score of 363 AU. This was 52th percentile for age and sex matched control.  Normal coronary origin with  right dominance.  CAD-RADS = 3. CT Mild stenosis in the proximal to mid LAD, Moderate stenosis is first diagonal branch, Mild stenosis in mid LCX, Moderate stenosis in mid RCA.  Mid ascending aorta measures 32mm, upper limit of normal, at the level of the PA bifurcation.  Mild Aortic atherosclerosis.  CT FFR analysis showed no significant stenosis.  RECOMMENDATIONS: CAD-RADS 3: Moderate stenosis. Consider symptom-guided anti-ischemic pharmacotherapy as well as risk factor modification per guideline directed care.  Heart Catheterization: None  LABORATORY DATA: External Labs: Collected: 07/14/2020 Creatinine 1mg /dL. eGFR: 72.3 mL/min per 1.73 m Lipid profile: Total cholesterol 190, triglycerides 80, HDL 45, LDL 129, non-HDL145 Hemoglobin A1c: 5  IMPRESSION:    ICD-10-CM   1. Nonobstructive atherosclerosis of coronary artery  I25.10   2. Coronary atherosclerosis due to calcified coronary lesion  I25.10    I25.84   3. Mixed hyperlipidemia  E78.2   4. Former smoker  Z87.891   5. Tiredness  R53.83      RECOMMENDATIONS: Vincent Jensen is a 80 y.o. male whose past medical history and cardiac risk factors include: Moderate coronary artery calcification, nonobstructive CAD per coronary CT FFR, hyperlipidemia, former smoker, advanced age.  Nonobstructive CAD:  He is noted to have nonobstructive CAD based on the most recent coronary CTA. Also noted to have moderate coronary artery calcification. Coronary CT FFR did not illustrate hemodynamically significant stenosis.  Continue aspirin and statin therapy.  Educated on the importance of secondary prevention by improving his modifiable cardiovascular risk factors.  Patient was started on metoprolol but due to feeling tired and fatigued the shared decision was to wean him off over 1 week and discontinue it.   He has not required the use of sublingual nitroglycerin tablets.  Since last visit he underwent an echocardiogram. Results  reviewed with him and his daughter during today's encounter.  Generalized tired fatigue:  Most likely secondary to noncardiac cause.  Echo notes preserved LVEF, no significant valvular heart disease.   We will wean off Lopressor over the next 1 week and then discontinue to see if his symptoms improve.   Patient has an appointment with his PCP later today and would recommend work-up for noncardiac causes of his tiredness and fatigue. May consider checking TSH, B12, vitamin D levels, and other non cardiac causes of generalized tired and fatigue  Coronary artery calcification:  Continue aspirin 81 mg p.o. daily.  Continue Lipitor 20 mg p.o. nightly.  Continue to monitor.   Mixed hyperlipidemia: Continue Lipitor 20 mg p.o. nightly. He is asked to have his most recent labs sent to the office for review after today's appt w/ PCP.   I would like to see him back in 6 months to reevaluate his symptoms or sooner if change in clinical status.   FINAL MEDICATION LIST END OF ENCOUNTER: No orders of the defined types were placed in this encounter.    Current Outpatient Medications:  .  acetaminophen (TYLENOL) 500 MG tablet, Take 500 mg by mouth every 6 (six) hours as  needed (pain). , Disp: , Rfl:  .  Ascorbic Acid (VITAMIN C PO), Take 1 tablet by mouth every other day. Alternate with vitamin b complex, Disp: , Rfl:  .  aspirin 81 MG tablet, Take 81 mg by mouth daily., Disp: , Rfl:  .  atorvastatin (LIPITOR) 20 MG tablet, TAKE 1 TABLET(20 MG) BY MOUTH AT BEDTIME, Disp: 90 tablet, Rfl: 0 .  B Complex-C (B-COMPLEX WITH VITAMIN C) tablet, Take 1 tablet by mouth every other day. Alternate with vitamin c, Disp: , Rfl:  .  Biotin 10000 MCG TABS, Take by mouth., Disp: , Rfl:  .  Cholecalciferol (D-3-5) 125 MCG (5000 UT) capsule, Take 5,000 Units by mouth daily., Disp: , Rfl:  .  co-enzyme Q-10 30 MG capsule, Take 30 mg by mouth 3 (three) times daily., Disp: , Rfl:  .  loratadine (CLARITIN) 10 MG  tablet, Take 10 mg by mouth daily., Disp: , Rfl:  .  loratadine-pseudoephedrine (CLARITIN-D 12-HOUR) 5-120 MG tablet, Take 1 tablet by mouth 2 (two) times daily., Disp: , Rfl:  .  Multiple Vitamin (MULITIVITAMIN WITH MINERALS) TABS, Take 1 tablet by mouth daily., Disp: , Rfl:  .  nitroGLYCERIN (NITROSTAT) 0.4 MG SL tablet, PLACE 1 TABLET UNDER THE TONGUE EVERY 5 MINGUTES AS NEEDED FOR CHEST PAIN. AFTER 2 TABLETS FIVE MINUTES APART GO TO NEAREST ER VIA EMS., Disp: 25 tablet, Rfl: 1 .  tamsulosin (FLOMAX) 0.4 MG CAPS capsule, Take 0.4 mg by mouth every evening. , Disp: , Rfl:   No orders of the defined types were placed in this encounter.   There are no Patient Instructions on file for this visit.   --Continue cardiac medications as reconciled in final medication list. --Return in about 6 months (around 05/26/2021) for Follow up, CAD. Or sooner if needed. --Continue follow-up with your primary care physician regarding the management of your other chronic comorbid conditions.  Patient's questions and concerns were addressed to his satisfaction. He voices understanding of the instructions provided during this encounter.   This note was created using a voice recognition software as a result there may be grammatical errors inadvertently enclosed that do not reflect the nature of this encounter. Every attempt is made to correct such errors.  Rex Kras, [place], [facility]  Pager: [phone] Office: [phone]

## 2020-12-01 NOTE — Telephone Encounter (Signed)
From pt

## 2020-12-05 DIAGNOSIS — Z961 Presence of intraocular lens: Secondary | ICD-10-CM | POA: Diagnosis not present

## 2020-12-05 DIAGNOSIS — H04123 Dry eye syndrome of bilateral lacrimal glands: Secondary | ICD-10-CM | POA: Diagnosis not present

## 2020-12-25 ENCOUNTER — Telehealth: Payer: Self-pay | Admitting: *Deleted

## 2020-12-25 DIAGNOSIS — Z006 Encounter for examination for normal comparison and control in clinical research program: Secondary | ICD-10-CM

## 2020-12-25 NOTE — Telephone Encounter (Signed)
I called patient for 90 day phone call for Identify Study. Patient is not having any cardiac symptoms. Patient does have mild lightheadness at times. Patient is following up with  DrTolia Sunit. I reminded to call patient for 1 year phone call in November.

## 2021-01-28 ENCOUNTER — Other Ambulatory Visit: Payer: Self-pay | Admitting: Cardiology

## 2021-01-28 DIAGNOSIS — I2510 Atherosclerotic heart disease of native coronary artery without angina pectoris: Secondary | ICD-10-CM

## 2021-02-24 NOTE — Telephone Encounter (Signed)
Yes, refill the lipitor 20mg  po qhs

## 2021-02-25 ENCOUNTER — Other Ambulatory Visit: Payer: Self-pay

## 2021-02-25 DIAGNOSIS — I2510 Atherosclerotic heart disease of native coronary artery without angina pectoris: Secondary | ICD-10-CM

## 2021-02-25 DIAGNOSIS — I2584 Coronary atherosclerosis due to calcified coronary lesion: Secondary | ICD-10-CM

## 2021-02-25 MED ORDER — ATORVASTATIN CALCIUM 20 MG PO TABS: 20.0000 mg | ORAL_TABLET | Freq: Every day | ORAL | 3 refills | Status: DC

## 2021-04-27 ENCOUNTER — Other Ambulatory Visit: Payer: Self-pay | Admitting: Cardiology

## 2021-04-27 DIAGNOSIS — I2510 Atherosclerotic heart disease of native coronary artery without angina pectoris: Secondary | ICD-10-CM

## 2021-04-27 DIAGNOSIS — I2584 Coronary atherosclerosis due to calcified coronary lesion: Secondary | ICD-10-CM

## 2021-05-26 ENCOUNTER — Other Ambulatory Visit: Payer: Self-pay

## 2021-05-26 ENCOUNTER — Encounter: Payer: Self-pay | Admitting: Cardiology

## 2021-05-26 ENCOUNTER — Ambulatory Visit: Payer: Medicare Other | Admitting: Cardiology

## 2021-05-26 VITALS — BP 115/72 | HR 68 | Temp 97.8°F | Resp 16 | Ht 68.0 in | Wt 161.0 lb

## 2021-05-26 DIAGNOSIS — R5383 Other fatigue: Secondary | ICD-10-CM

## 2021-05-26 DIAGNOSIS — Z87891 Personal history of nicotine dependence: Secondary | ICD-10-CM

## 2021-05-26 DIAGNOSIS — E782 Mixed hyperlipidemia: Secondary | ICD-10-CM | POA: Diagnosis not present

## 2021-05-26 DIAGNOSIS — I2510 Atherosclerotic heart disease of native coronary artery without angina pectoris: Secondary | ICD-10-CM

## 2021-05-26 DIAGNOSIS — I2584 Coronary atherosclerosis due to calcified coronary lesion: Secondary | ICD-10-CM | POA: Diagnosis not present

## 2021-05-26 NOTE — Progress Notes (Signed)
Date:  05/26/2021   ID:  Vincent Jensen, DOB 16-Jan-1941, MRN [id number]  PCP:  Reynold Bowen, MD  Cardiologist:  Rex Kras, DO, [facility] (established care 07/31/2020)  Date: 05/26/21 Last Office Visit: 11/26/2020  Chief Complaint  Patient presents with   Nonobstructive atherosclerosis of coronary artery   Coronary atherosclerosis due to calcified coronary lesion   Follow-up    6 month    HPI  Vincent Jensen is a 80 y.o. male who presents to the office with a chief complaint of " 6 month follow up for CAD." Patient's past medical history and cardiovascular risk factors include: Moderate coronary artery calcification, nonobstructive CAD per coronary CT FFR, hyperlipidemia, former smoker, advanced age.  He is referred to the office at the request of Reynold Bowen, MD for evaluation of general adult examination, coronary artery calcification, throat tightness.  Due to not gated CT study noting Scattered coronary calcification and symptoms of angina he underwent coronary CTA with FFR.  He was noted to have nonobstructive CAD and CT FFR did not illustrate hemodynamically significant stenosis.  Since then his medications have been uptitrated for symptom relief.  And he has been on aspirin and statin therapy given his coronary calcification and tolerating the medical therapy well without any side effects or intolerances.  Patient presents today for 9-month follow-up visit and denies any anginal discomfort.  No use of sublingual nitroglycerin tablets.  He continues to feel decreased stamina and dyspnea on exertion after playing soccer for some time.  He also states that he sleeps well during the night but does not feel well rested.    ALLERGIES: No Known Allergies  MEDICATION LIST PRIOR TO VISIT: Current Meds  Medication Sig   acetaminophen (TYLENOL) 500 MG tablet Take 500 mg by mouth every 6 (six) hours as needed (pain).    Ascorbic Acid (VITAMIN C PO) Take 1 tablet by mouth every  other day. Alternate with vitamin b complex   aspirin 81 MG tablet Take 81 mg by mouth as needed.   atorvastatin (LIPITOR) 20 MG tablet TAKE 1 TABLET(20 MG) BY MOUTH AT BEDTIME   B Complex-C (B-COMPLEX WITH VITAMIN C) tablet Take 1 tablet by mouth every other day. Alternate with vitamin c   Biotin 10000 MCG TABS Take by mouth.   Cholecalciferol (D-3-5) 125 MCG (5000 UT) capsule Take 5,000 Units by mouth daily.   co-enzyme Q-10 30 MG capsule Take 30 mg by mouth 3 (three) times daily.   loratadine (CLARITIN) 10 MG tablet Take 10 mg by mouth daily.   loratadine-pseudoephedrine (CLARITIN-D 12-HOUR) 5-120 MG tablet Take 1 tablet by mouth 2 (two) times daily.   Multiple Vitamin (MULITIVITAMIN WITH MINERALS) TABS Take 1 tablet by mouth daily.   nitroGLYCERIN (NITROSTAT) 0.4 MG SL tablet PLACE 1 TABLET UNDER THE TONGUE EVERY 5 MINGUTES AS NEEDED FOR CHEST PAIN. AFTER 2 TABLETS FIVE MINUTES APART GO TO NEAREST ER VIA EMS.   tamsulosin (FLOMAX) 0.4 MG CAPS capsule Take 0.4 mg by mouth every evening.      PAST MEDICAL HISTORY: Past Medical History:  Diagnosis Date   Aplastic anemia ([place])    Arthritis    Diverticulosis    History of blood transfusion    Hyperlipidemia    PNH (paroxysmal nocturnal hemoglobinuria) (HCC)    Prostate nodule - left lobe 08/29/2013   DRE 08/29/2013     PAST SURGICAL HISTORY: Past Surgical History:  Procedure Laterality Date   APPENDECTOMY     COLONOSCOPY  JOINT REPLACEMENT     KNEE SURGERY Bilateral    knee replacement surg/Bil   SHOULDER ARTHROSCOPY Left    left shoulder /replacement surg   TONSILLECTOMY     TOTAL HIP ARTHROPLASTY Bilateral    Bil   TOTAL SHOULDER ARTHROPLASTY Right 09/12/2014   Procedure: RIGHT TOTAL SHOULDER ARTHROPLASTY;  Surgeon: Marin Shutter, MD;  Location: [place];  Service: Orthopedics;  Laterality: Right;    FAMILY HISTORY: The patient family history includes Cancer in his father; Heart disease in his maternal aunt and maternal  uncle; Myasthenia gravis in his mother.  SOCIAL HISTORY:  The patient  reports that he quit smoking about 49 years ago. His smoking use included cigarettes. He has a 10.00 pack-year smoking history. He has never used smokeless tobacco. He reports that he does not drink alcohol and does not use drugs.  REVIEW OF SYSTEMS: Review of Systems  Constitutional: Positive for malaise/fatigue. Negative for chills and fever.  HENT:  Negative for hoarse voice and nosebleeds.   Eyes:  Negative for discharge, double vision and pain.  Cardiovascular:  Positive for dyspnea on exertion. Negative for chest pain, claudication, leg swelling, near-syncope, orthopnea, palpitations, paroxysmal nocturnal dyspnea and syncope.  Respiratory:  Positive for snoring. Negative for hemoptysis and shortness of breath.   Musculoskeletal:  Negative for muscle cramps and myalgias.  Gastrointestinal:  Negative for abdominal pain, constipation, diarrhea, hematemesis, hematochezia, melena, nausea and vomiting.  Neurological:  Negative for dizziness and light-headedness.   PHYSICAL EXAM: Vitals with BMI 05/26/2021 11/26/2020 08/25/2020  Height $Remov'5\' 8"'RvIiJc$  $Remove'5\' 8"'HCsyHxA$  $RemoveB'5\' 8"'PJygdHIg$   Weight 161 lbs 167 lbs 166 lbs  BMI 24.49 51.7 00.17  Systolic 494 496 759  Diastolic 72 68 76  Pulse 68 67 83   CONSTITUTIONAL: Well-developed and well-nourished. No acute distress.  SKIN: Skin is warm and dry. No rash noted. No cyanosis. No pallor. No jaundice HEAD: Normocephalic and atraumatic.  EYES: No scleral icterus MOUTH/THROAT: Moist oral membranes.  NECK: No JVD present. No thyromegaly noted. No carotid bruits  LYMPHATIC: No visible cervical adenopathy.  CHEST Normal respiratory effort. No intercostal retractions  LUNGS: Clear to auscultation bilaterally. No stridor. No wheezes. No rales.  CARDIOVASCULAR: Regular rate and rhythm, positive S1-S2, no murmurs rubs or gallops appreciated. ABDOMINAL: No apparent ascites.  EXTREMITIES: No peripheral edema   HEMATOLOGIC: No significant bruising NEUROLOGIC: Oriented to person, place, and time. Nonfocal. Normal muscle tone.  PSYCHIATRIC: Normal mood and affect. Normal behavior. Cooperative  CTA chest PE w/ or w/o contrast:  12/08/2011:  Scattered coronary artery calcifications seen.   CARDIAC DATABASE: EKG: 05/26/2021: Normal sinus rhythm, 60 bpm, left axis, nonspecific QRS widening, left anterior fascicular block, without underlying ischemia or injury pattern.  Echocardiogram: 11/19/2020: Left ventricle cavity is normal in size. Mild concentric hypertrophy of the left ventricle. Normal global wall motion. Normal LV systolic function with EF 55%. Doppler evidence of grade I (impaired) diastolic dysfunction, normal LAP. Calculated EF 55%. Left atrial cavity is mildly dilated. Right atrial cavity is mildly dilated. Mild to moderate mitral regurgitation. Mild tricuspid regurgitation. Estimated pulmonary artery systolic pressure 31 mmHg.   Stress Testing: No results found for this or any previous visit from the past 1095 days.  08/13/2020 CCTA:  Coronary calcium score of 363 AU. This was 52th percentile for age and sex matched control.  Normal coronary origin with right dominance.  CAD-RADS = 3. CT Mild stenosis in the proximal to mid LAD, Moderate stenosis is first diagonal branch, Mild stenosis  in mid LCX, Moderate stenosis in mid RCA.  Mid ascending aorta measures 76mm, upper limit of normal, at the level of the PA bifurcation.  Mild Aortic atherosclerosis.  CT FFR analysis showed no significant stenosis.  RECOMMENDATIONS: CAD-RADS 3: Moderate stenosis. Consider symptom-guided anti-ischemic pharmacotherapy as well as risk factor modification per guideline directed care.  Heart Catheterization: None  LABORATORY DATA: External Labs: Collected: 07/14/2020 Creatinine 1mg /dL. eGFR: 72.3 mL/min per 1.73 m Lipid profile: Total cholesterol 190, triglycerides 80, HDL 45, LDL 129,  non-HDL145 Hemoglobin A1c: 5  IMPRESSION:    ICD-10-CM   1. Coronary atherosclerosis due to calcified coronary lesion  I25.10 EKG 12-Lead   I25.84     2. Nonobstructive atherosclerosis of coronary artery  I25.10     3. Former smoker  Z87.891     4. Mixed hyperlipidemia  E78.2     5. Tiredness  R53.83        RECOMMENDATIONS: Vincent Jensen is a 80 y.o. male whose past medical history and cardiac risk factors include: Moderate coronary artery calcification, nonobstructive CAD per coronary CT FFR, hyperlipidemia, former smoker, advanced age.  Nonobstructive CAD: Total Coronary calcium score of 363 AU with mild/moderate nonobstructive CAD. Chest pain-free. Has not required sublingual nitroglycerin tablets since last visit. Continue aspirin and statin therapy. Educated on the importance of secondary prevention by improving his modifiable cardiovascular risk factors. Was on metoprolol in the past but since he was feeling tired and fatigue the medication was weaned off.  However the symptoms continue despite not being on beta-blocker therapy.  Therefore suspect other noncardiac cause.    Generalized tired fatigue: Most likely secondary to noncardiac cause. Echo notes preserved LVEF, no significant valvular heart disease.   Patient has an appointment with his PCP later this week and would recommend work-up for noncardiac causes of his tiredness and fatigue. May consider checking TSH, B12, vitamin D levels, and other non cardiac causes of generalized tired and fatigue Patient would also benefit from being evaluated for sleep apnea given his symptoms.  Will defer to PCP at this time.  Coronary artery calcification: Continue aspirin 81 mg p.o. daily. Continue Lipitor 20 mg p.o. nightly. Continue to monitor.   Mixed hyperlipidemia: Continue Lipitor 20 mg p.o. nightly. He is asked to have his most recent labs sent to the office for review after upcoming appt w/ PCP.   I would like to  see him back in 6 months to reevaluate his symptoms or sooner if change in clinical status.   FINAL MEDICATION LIST END OF ENCOUNTER: No orders of the defined types were placed in this encounter.    Current Outpatient Medications:    acetaminophen (TYLENOL) 500 MG tablet, Take 500 mg by mouth every 6 (six) hours as needed (pain). , Disp: , Rfl:    Ascorbic Acid (VITAMIN C PO), Take 1 tablet by mouth every other day. Alternate with vitamin b complex, Disp: , Rfl:    aspirin 81 MG tablet, Take 81 mg by mouth as needed., Disp: , Rfl:    atorvastatin (LIPITOR) 20 MG tablet, TAKE 1 TABLET(20 MG) BY MOUTH AT BEDTIME, Disp: 90 tablet, Rfl: 3   B Complex-C (B-COMPLEX WITH VITAMIN C) tablet, Take 1 tablet by mouth every other day. Alternate with vitamin c, Disp: , Rfl:    Biotin 10000 MCG TABS, Take by mouth., Disp: , Rfl:    Cholecalciferol (D-3-5) 125 MCG (5000 UT) capsule, Take 5,000 Units by mouth daily., Disp: , Rfl:    co-enzyme  Q-10 30 MG capsule, Take 30 mg by mouth 3 (three) times daily., Disp: , Rfl:    loratadine (CLARITIN) 10 MG tablet, Take 10 mg by mouth daily., Disp: , Rfl:    loratadine-pseudoephedrine (CLARITIN-D 12-HOUR) 5-120 MG tablet, Take 1 tablet by mouth 2 (two) times daily., Disp: , Rfl:    Multiple Vitamin (MULITIVITAMIN WITH MINERALS) TABS, Take 1 tablet by mouth daily., Disp: , Rfl:    nitroGLYCERIN (NITROSTAT) 0.4 MG SL tablet, PLACE 1 TABLET UNDER THE TONGUE EVERY 5 MINGUTES AS NEEDED FOR CHEST PAIN. AFTER 2 TABLETS FIVE MINUTES APART GO TO NEAREST ER VIA EMS., Disp: 25 tablet, Rfl: 1   tamsulosin (FLOMAX) 0.4 MG CAPS capsule, Take 0.4 mg by mouth every evening. , Disp: , Rfl:   Orders Placed This Encounter  Procedures   EKG 12-Lead     There are no Patient Instructions on file for this visit.   --Continue cardiac medications as reconciled in final medication list. --Return in about 6 months (around 11/26/2021) for Follow up nonobstructive CAD . Or sooner if  needed. --Continue follow-up with your primary care physician regarding the management of your other chronic comorbid conditions.  Patient's questions and concerns were addressed to his satisfaction. He voices understanding of the instructions provided during this encounter.   This note was created using a voice recognition software as a result there may be grammatical errors inadvertently enclosed that do not reflect the nature of this encounter. Every attempt is made to correct such errors.  Rex Kras, [place], [facility]  Pager: [phone] Office: [phone]

## 2021-05-28 DIAGNOSIS — Z125 Encounter for screening for malignant neoplasm of prostate: Secondary | ICD-10-CM | POA: Diagnosis not present

## 2021-05-28 DIAGNOSIS — I2584 Coronary atherosclerosis due to calcified coronary lesion: Secondary | ICD-10-CM | POA: Diagnosis not present

## 2021-05-28 DIAGNOSIS — N401 Enlarged prostate with lower urinary tract symptoms: Secondary | ICD-10-CM | POA: Diagnosis not present

## 2021-05-28 DIAGNOSIS — D619 Aplastic anemia, unspecified: Secondary | ICD-10-CM | POA: Diagnosis not present

## 2021-05-28 DIAGNOSIS — D595 Paroxysmal nocturnal hemoglobinuria [Marchiafava-Micheli]: Secondary | ICD-10-CM | POA: Diagnosis not present

## 2021-05-28 DIAGNOSIS — E785 Hyperlipidemia, unspecified: Secondary | ICD-10-CM | POA: Diagnosis not present

## 2021-05-28 DIAGNOSIS — R634 Abnormal weight loss: Secondary | ICD-10-CM | POA: Diagnosis not present

## 2021-05-28 DIAGNOSIS — F3289 Other specified depressive episodes: Secondary | ICD-10-CM | POA: Diagnosis not present

## 2021-05-28 DIAGNOSIS — E559 Vitamin D deficiency, unspecified: Secondary | ICD-10-CM | POA: Diagnosis not present

## 2021-05-28 DIAGNOSIS — R7302 Impaired glucose tolerance (oral): Secondary | ICD-10-CM | POA: Diagnosis not present

## 2021-05-28 DIAGNOSIS — R946 Abnormal results of thyroid function studies: Secondary | ICD-10-CM | POA: Diagnosis not present

## 2021-05-28 DIAGNOSIS — I700 Atherosclerosis of aorta: Secondary | ICD-10-CM | POA: Diagnosis not present

## 2021-07-07 DIAGNOSIS — S91301A Unspecified open wound, right foot, initial encounter: Secondary | ICD-10-CM | POA: Diagnosis not present

## 2021-07-07 DIAGNOSIS — T148XXA Other injury of unspecified body region, initial encounter: Secondary | ICD-10-CM | POA: Diagnosis not present

## 2021-07-07 DIAGNOSIS — L84 Corns and callosities: Secondary | ICD-10-CM | POA: Diagnosis not present

## 2021-07-07 DIAGNOSIS — L089 Local infection of the skin and subcutaneous tissue, unspecified: Secondary | ICD-10-CM | POA: Diagnosis not present

## 2021-07-20 ENCOUNTER — Ambulatory Visit: Payer: Managed Care, Other (non HMO) | Admitting: Podiatry

## 2021-07-28 ENCOUNTER — Encounter: Payer: Self-pay | Admitting: Podiatry

## 2021-07-28 ENCOUNTER — Other Ambulatory Visit: Payer: Self-pay

## 2021-07-28 ENCOUNTER — Ambulatory Visit (INDEPENDENT_AMBULATORY_CARE_PROVIDER_SITE_OTHER): Payer: No Typology Code available for payment source | Admitting: Podiatry

## 2021-07-28 ENCOUNTER — Ambulatory Visit (INDEPENDENT_AMBULATORY_CARE_PROVIDER_SITE_OTHER): Payer: No Typology Code available for payment source

## 2021-07-28 VITALS — Temp 97.9°F

## 2021-07-28 DIAGNOSIS — I2510 Atherosclerotic heart disease of native coronary artery without angina pectoris: Secondary | ICD-10-CM

## 2021-07-28 DIAGNOSIS — L97511 Non-pressure chronic ulcer of other part of right foot limited to breakdown of skin: Secondary | ICD-10-CM | POA: Diagnosis not present

## 2021-07-28 DIAGNOSIS — M2061 Acquired deformities of toe(s), unspecified, right foot: Secondary | ICD-10-CM

## 2021-07-28 DIAGNOSIS — I2584 Coronary atherosclerosis due to calcified coronary lesion: Secondary | ICD-10-CM | POA: Diagnosis not present

## 2021-07-28 DIAGNOSIS — M21619 Bunion of unspecified foot: Secondary | ICD-10-CM | POA: Diagnosis not present

## 2021-07-28 DIAGNOSIS — M216X1 Other acquired deformities of right foot: Secondary | ICD-10-CM | POA: Diagnosis not present

## 2021-08-04 NOTE — Progress Notes (Signed)
Subjective:   Patient ID: Vincent Jensen, male   DOB: 80 y.o.   MRN: [id number]   HPI 80 year old male presents the office with concerns of a wound on the bottom of his right foot submetatarsal 2.  He states that he had an issue about 6 years ago while playing soccer and he developed an infection and since then he has some swelling to his foot.  However recently about 2 to 3 months ago he was playing in a soccer tournament and again had a callus.  He did have some drainage previously.  He saw his doctor about 2 to 3 weeks ago and is been using mupirocin ointment.  Currently minimal drainage.  No pus.  No swelling or redness.  He has no other concerns today.  No fevers or chills.   Review of Systems  All other systems reviewed and are negative.  Past Medical History:  Diagnosis Date   Aplastic anemia (HCC)    Arthritis    Diverticulosis    History of blood transfusion    Hyperlipidemia    PNH (paroxysmal nocturnal hemoglobinuria) (HCC)    Prostate nodule - left lobe 08/29/2013   DRE 08/29/2013     Past Surgical History:  Procedure Laterality Date   APPENDECTOMY     COLONOSCOPY     JOINT REPLACEMENT     KNEE SURGERY Bilateral    knee replacement surg/Bil   SHOULDER ARTHROSCOPY Left    left shoulder /replacement surg   TONSILLECTOMY     TOTAL HIP ARTHROPLASTY Bilateral    Bil   TOTAL SHOULDER ARTHROPLASTY Right 09/12/2014   Procedure: RIGHT TOTAL SHOULDER ARTHROPLASTY;  Surgeon: Senaida Lange, MD;  Location: [facility];  Service: Orthopedics;  Laterality: Right;     Current Outpatient Medications:    acetaminophen (TYLENOL) 500 MG tablet, Take 500 mg by mouth every 6 (six) hours as needed (pain). , Disp: , Rfl:    Ascorbic Acid (VITAMIN C PO), Take 1 tablet by mouth every other day. Alternate with vitamin b complex, Disp: , Rfl:    aspirin 81 MG tablet, Take 81 mg by mouth as needed., Disp: , Rfl:    atorvastatin (LIPITOR) 20 MG tablet, TAKE 1 TABLET(20 MG) BY MOUTH AT BEDTIME,  Disp: 90 tablet, Rfl: 3   B Complex-C (B-COMPLEX WITH VITAMIN C) tablet, Take 1 tablet by mouth every other day. Alternate with vitamin c, Disp: , Rfl:    Biotin 35573 MCG TABS, Take by mouth., Disp: , Rfl:    Cholecalciferol (D-3-5) 125 MCG (5000 UT) capsule, Take 5,000 Units by mouth daily., Disp: , Rfl:    co-enzyme Q-10 30 MG capsule, Take 30 mg by mouth 3 (three) times daily., Disp: , Rfl:    doxycycline (VIBRA-TABS) 100 MG tablet, Take 100 mg by mouth 2 (two) times daily., Disp: , Rfl:    loratadine (CLARITIN) 10 MG tablet, Take 10 mg by mouth daily., Disp: , Rfl:    loratadine-pseudoephedrine (CLARITIN-D 12-HOUR) 5-120 MG tablet, Take 1 tablet by mouth 2 (two) times daily., Disp: , Rfl:    Multiple Vitamin (MULITIVITAMIN WITH MINERALS) TABS, Take 1 tablet by mouth daily., Disp: , Rfl:    mupirocin ointment (BACTROBAN) 2 %, Apply topically 2 (two) times daily., Disp: , Rfl:    nitroGLYCERIN (NITROSTAT) 0.4 MG SL tablet, PLACE 1 TABLET UNDER THE TONGUE EVERY 5 MINGUTES AS NEEDED FOR CHEST PAIN. AFTER 2 TABLETS FIVE MINUTES APART GO TO NEAREST ER VIA EMS., Disp: 25 tablet, Rfl:  1   tamsulosin (FLOMAX) 0.4 MG CAPS capsule, Take 0.4 mg by mouth every evening. , Disp: , Rfl:   No Known Allergies        Objective:  Physical Exam  General: AAO x3, NAD  Dermatological: On the right foot submetatarsal 2 is a hyperkeratotic lesion.  Upon debridement small superficial granular wound is present with some macerated tissue.  There is no surrounding erythema, ascending cellulitis.  No fluctuance crepitation.  No malodor.  Vascular: Dorsalis Pedis artery and Posterior Tibial artery pedal pulses are 2/4 bilateral with immedate capillary fill time.  There is no pain with calf compression, swelling, warmth, erythema.   Neruologic: Grossly intact via light touch bilateral.   Musculoskeletal: Significant bunions present on the right foot with subluxation of the lesser digits.  Muscular strength 5/5 in  all groups tested bilateral.  Gait: Unassisted, Nonantalgic.       Assessment:   Ulceration right foot     Plan:  -Treatment options discussed including all alternatives, risks, and complications -Etiology of symptoms were discussed -X-rays were obtained and reviewed with the patient.  Significant bunions present with subluxation of the lesser digits.  No evidence of acute osteomyelitis. -Sharply debrided the hyperkeratotic lesion to reveal the underlying ulceration.  Debrided utilizing a #3 curette scalpel down to healthy, granular tissue to help promote wound healing.  No blood loss.  Tolerated well.  The wound appeared to be superficial.  There is macerated tissue.  Given the maceration I recommend Betadine dressing changes for now.  Prescribe doxycycline.  Offloading pads were dispensed.  Long-term discussed making an orthotic to help accommodate and offload this particular area in his soccer shoes if possible.  -Monitor for any clinical signs or symptoms of infection and directed to call the office immediately should any occur or go to the ER.  Return in about 3 weeks (around 08/18/2021).  Vivi Barrack DPM

## 2021-08-18 ENCOUNTER — Other Ambulatory Visit: Payer: Self-pay

## 2021-08-18 ENCOUNTER — Ambulatory Visit (INDEPENDENT_AMBULATORY_CARE_PROVIDER_SITE_OTHER): Payer: No Typology Code available for payment source | Admitting: Podiatry

## 2021-08-18 ENCOUNTER — Encounter: Payer: Self-pay | Admitting: Podiatry

## 2021-08-18 DIAGNOSIS — M21619 Bunion of unspecified foot: Secondary | ICD-10-CM

## 2021-08-18 DIAGNOSIS — I2510 Atherosclerotic heart disease of native coronary artery without angina pectoris: Secondary | ICD-10-CM

## 2021-08-18 DIAGNOSIS — L84 Corns and callosities: Secondary | ICD-10-CM

## 2021-08-18 DIAGNOSIS — L97511 Non-pressure chronic ulcer of other part of right foot limited to breakdown of skin: Secondary | ICD-10-CM | POA: Diagnosis not present

## 2021-08-18 DIAGNOSIS — I2584 Coronary atherosclerosis due to calcified coronary lesion: Secondary | ICD-10-CM | POA: Diagnosis not present

## 2021-08-18 DIAGNOSIS — M216X1 Other acquired deformities of right foot: Secondary | ICD-10-CM | POA: Diagnosis not present

## 2021-08-18 NOTE — Progress Notes (Signed)
Subjective: 80 year old male presents the office today for follow-up evaluation of an ulcer on the bottom of his right foot submetatarsal 2.  He states the wound itself is doing much better he still has discomfort to the ball of his foot.  He is very active and likes to play soccer.  No redness or swelling or drainage.  Denies any fevers or chills.  No other concerns.  Objective: AAO x3, NAD DP/PT pulses palpable bilaterally, CRT less than 3 seconds Severe bunion is present on the right foot with prominent metatarsal heads plantarly with atrophy of the fat pad particular submetatarsal 2.  Hammertoe contractures are present.  There is minimal hyperkeratotic tissue right foot submetatarsal 2 and upon debridement there is no underlying ulceration drainage or signs of infection.  There is no area of pinpoint tenderness.  Trace edema.  There is no fluctuation or crepitation. No open lesions or pre-ulcerative lesions.  No pain with calf compression, swelling, warmth, erythema  Assessment: Prominent metatarsal head, multiple digital deformities, severe bunion  Plan: -All treatment options discussed with the patient including all alternatives, risks, complications.  -Likely debrided the hyperkeratotic tissue.  There is no underlying ulceration drainage or any signs of infection.  At this point the wound is healed.  Discussed long-term treatment to help avoid pressure to help with his pain as well as to prevent reoccurrence.  Discussed orthotics but he wishes to hold off on this.  Continue offloading pads and monitor for any reoccurrence. -Patient encouraged to call the office with any questions, concerns, change in symptoms.   Vivi Barrack DPM

## 2021-11-27 ENCOUNTER — Ambulatory Visit: Payer: Medicare Other | Admitting: Cardiology

## 2021-11-27 ENCOUNTER — Other Ambulatory Visit: Payer: Self-pay

## 2021-11-27 ENCOUNTER — Encounter: Payer: Self-pay | Admitting: Cardiology

## 2021-11-27 VITALS — BP 120/70 | HR 74 | Temp 98.5°F | Resp 16 | Ht 68.0 in | Wt 168.8 lb

## 2021-11-27 DIAGNOSIS — R5383 Other fatigue: Secondary | ICD-10-CM | POA: Diagnosis not present

## 2021-11-27 DIAGNOSIS — I2510 Atherosclerotic heart disease of native coronary artery without angina pectoris: Secondary | ICD-10-CM | POA: Diagnosis not present

## 2021-11-27 DIAGNOSIS — E782 Mixed hyperlipidemia: Secondary | ICD-10-CM

## 2021-11-27 DIAGNOSIS — Z87891 Personal history of nicotine dependence: Secondary | ICD-10-CM

## 2021-11-27 DIAGNOSIS — I2584 Coronary atherosclerosis due to calcified coronary lesion: Secondary | ICD-10-CM | POA: Diagnosis not present

## 2021-11-27 NOTE — Progress Notes (Signed)
ID:  Crue, Otero [birth year], MRN [id number]  PCP:  Reynold Bowen, MD  Cardiologist:  Rex Kras, DO, [facility] (established care 07/31/2020)  Date: 11/27/21 Last Office Visit: 05/26/2021  Chief Complaint  Patient presents with   Coronary Artery Disease   Follow-up    HPI  Vincent Jensen is a 81 y.o. male who presents to the office with a chief complaint of " 6 month follow up for CAD." Patient's past medical history and cardiovascular risk factors include: Moderate coronary artery calcification, nonobstructive CAD per coronary CT FFR, hyperlipidemia, former smoker, advanced age.  He is referred to the office at the request of Reynold Bowen, MD for evaluation of general adult examination, coronary artery calcification, throat tightness.  In the past, nongated CT study noted scattered coronary artery calcification.  In the setting of symptoms to suggest angina pectoris he underwent coronary CTA plus FFR which noted moderate coronary artery calcification and nonobstructive CAD as the lesions were not hemodynamically significant per CT FFR.  Since then his medications have been uptitrated and he is educated on the importance of risk factor modification for secondary prevention.  He now presents for 84-monthfollow-up visit.  Since last visit he is doing well from a cardiovascular standpoint.  Denies angina pectoris or heart failure symptoms.  No hospitalizations/urgent care visits for cardiac symptoms.  No use of sublingual nitroglycerin tablets.  He is not playing soccer does regularly since November 2022.  Patient states that he does sleep well at night but does not feel rested when he wakes up.  He has not been evaluated for sleep apnea.  ALLERGIES: No Known Allergies  MEDICATION LIST PRIOR TO VISIT: Current Meds  Medication Sig   acetaminophen (TYLENOL) 500 MG tablet Take 500 mg by mouth every 6 (six) hours as needed (pain).    Ascorbic Acid (VITAMIN C PO) Take 1 tablet by  mouth every other day. Alternate with vitamin b complex   aspirin 81 MG tablet Take 81 mg by mouth as needed.   atorvastatin (LIPITOR) 20 MG tablet TAKE 1 TABLET(20 MG) BY MOUTH AT BEDTIME   B Complex-C (B-COMPLEX WITH VITAMIN C) tablet Take 1 tablet by mouth every other day. Alternate with vitamin c   Biotin 10000 MCG TABS Take by mouth.   Cholecalciferol (D-3-5) 125 MCG (5000 UT) capsule Take 5,000 Units by mouth daily.   co-enzyme Q-10 30 MG capsule Take 30 mg by mouth 3 (three) times daily.   Multiple Vitamin (MULITIVITAMIN WITH MINERALS) TABS Take 1 tablet by mouth daily.   nitroGLYCERIN (NITROSTAT) 0.4 MG SL tablet PLACE 1 TABLET UNDER THE TONGUE EVERY 5 MINGUTES AS NEEDED FOR CHEST PAIN. AFTER 2 TABLETS FIVE MINUTES APART GO TO NEAREST ER VIA EMS.   tamsulosin (FLOMAX) 0.4 MG CAPS capsule Take 0.4 mg by mouth every evening.      PAST MEDICAL HISTORY: Past Medical History:  Diagnosis Date   [place]plastic [place] ([place]    Arthritis    Diverticulosis    History of blood transfusion    Hyperlipidemia    PNH (paroxysmal nocturnal hemoglobinuria) (HCC)    Prostate nodule - left lobe 08/29/2013   DRE 08/29/2013     PAST SURGICAL HISTORY: Past Surgical History:  Procedure Laterality Date   APPENDECTOMY     COLONOSCOPY     JOINT REPLACEMENT     KNEE SURGERY Bilateral    knee replacement surg/Bil   SHOULDER ARTHROSCOPY Left    left shoulder /replacement surg  TONSILLECTOMY     TOTAL HIP ARTHROPLASTY Bilateral    Bil   TOTAL SHOULDER ARTHROPLASTY Right 09/12/2014   Procedure: RIGHT TOTAL SHOULDER ARTHROPLASTY;  Surgeon: Marin Shutter, MD;  Location: [facility];  Service: Orthopedics;  Laterality: Right;    FAMILY HISTORY: The patient family history includes Cancer in his father; Heart disease in his maternal aunt and maternal uncle; Myasthenia gravis in his mother.  SOCIAL HISTORY:  The patient  reports that he quit smoking about 49 years ago. His smoking use included cigarettes. He  has a 10.00 pack-year smoking history. He has never used smokeless tobacco. He reports that he does not drink alcohol and does not use drugs.  REVIEW OF SYSTEMS: Review of Systems  Constitutional: Positive for malaise/fatigue. Negative for chills and fever.  HENT:  Negative for hoarse voice and nosebleeds.   Eyes:  Negative for discharge, double vision and pain.  Cardiovascular:  Negative for chest pain, claudication, dyspnea on exertion, leg swelling, near-syncope, orthopnea, palpitations, paroxysmal nocturnal dyspnea and syncope.  Respiratory:  Positive for snoring. Negative for hemoptysis and shortness of breath.   Musculoskeletal:  Negative for muscle cramps and myalgias.  Gastrointestinal:  Negative for abdominal pain, constipation, diarrhea, hematemesis, hematochezia, melena, nausea and vomiting.  Neurological:  Negative for dizziness and light-headedness.   PHYSICAL EXAM: Vitals with BMI 11/27/2021 05/26/2021 11/26/2020  Height 5' 8" 5' 8" 5' 8"  Weight 168 lbs 13 oz 161 lbs 167 lbs  BMI 25.67 09.73 53.2  Systolic 992 426 834  Diastolic 70 72 68  Pulse 74 68 67   CONSTITUTIONAL: Well-developed and well-nourished. No acute distress.  SKIN: Skin is warm and dry. No rash noted. No cyanosis. No pallor. No jaundice HEAD: Normocephalic and atraumatic.  EYES: No scleral icterus MOUTH/THROAT: Moist oral membranes.  NECK: No JVD present. No thyromegaly noted. No carotid bruits  LYMPHATIC: No visible cervical adenopathy.  CHEST Normal respiratory effort. No intercostal retractions  LUNGS: Clear to auscultation bilaterally. No stridor. No wheezes. No rales.  CARDIOVASCULAR: Regular rate and rhythm, positive S1-S2, no murmurs rubs or gallops appreciated. ABDOMINAL: No apparent ascites.  EXTREMITIES: No peripheral edema  HEMATOLOGIC: No significant bruising NEUROLOGIC: Oriented to person, place, and time. Nonfocal. Normal muscle tone.  PSYCHIATRIC: Normal mood and affect. Normal behavior.  Cooperative  CTA chest PE w/ or w/o contrast:  12/08/2011:  Scattered coronary artery calcifications seen.   CARDIAC DATABASE: EKG: 11/27/2021: NSR, 75bpm, LAD, LAFB, consider old anteroseptal infarct.  Echocardiogram: 11/19/2020: Left ventricle cavity is normal in size. Mild concentric hypertrophy of the left ventricle. Normal global wall motion. Normal LV systolic function with EF 55%.  Doppler evidence of grade I (impaired) diastolic dysfunction, normal LAP. Calculated EF 55%. Left atrial cavity is mildly dilated. Right atrial cavity is mildly dilated. Mild to moderate mitral regurgitation. Mild tricuspid regurgitation. Estimated pulmonary artery systolic pressure 31 mmHg.   Stress Testing: No results found for this or any previous visit from the past 1095 days.  08/13/2020 CCTA:  Coronary calcium score of 363 AU. This was 52th percentile for age and sex matched control.  Normal coronary origin with right dominance.  CAD-RADS = 3. CT Mild stenosis in the proximal to mid LAD, Moderate stenosis is first diagonal branch, Mild stenosis in mid LCX, Moderate stenosis in mid RCA.  Mid ascending aorta measures 40m, upper limit of normal, at the level of the PA bifurcation.  Mild Aortic atherosclerosis.  CT FFR analysis showed no significant stenosis.  RECOMMENDATIONS: CAD-RADS  3: Moderate stenosis. Consider symptom-guided anti-ischemic pharmacotherapy as well as risk factor modification per guideline directed care.  Heart Catheterization: None  LABORATORY DATA: External Labs: Collected: 07/14/2020 Creatinine 48m/dL. eGFR: 72.3 mL/min per 1.73 m Lipid profile: Total cholesterol 190, triglycerides 80, HDL 45, LDL 129, non-HDL145 Hemoglobin A1c: 5  External Labs: Collected: 05/28/2021 available in Care Everywhere. Sodium 140, potassium 4.5, chloride 107, bicarb 24, BUN 15, creatinine 0.8. Morning cortisol 9.2. Hemoglobin 11.9 g/dL, hematocrit 36.4% Total cholesterol 144,  triglycerides 88, HDL 47, LDL 79, non-HDL 97. AST 17, ALT 17, alkaline phosphatase 99. TSH 2.56. A1c 5.5   IMPRESSION:    ICD-10-CM   1. Coronary atherosclerosis due to calcified coronary lesion  I25.10 EKG 12-Lead   I25.84     2. Nonobstructive atherosclerosis of coronary artery  I25.10     3. Tiredness  R53.83     4. Mixed hyperlipidemia  E78.2     5. Former smoker  Z87.891        RECOMMENDATIONS: Duaine GNavaretteis a 81y.o. male whose past medical history and cardiac risk factors include: Moderate coronary artery calcification, nonobstructive CAD per coronary CT FFR, hyperlipidemia, former smoker, advanced age.  Nonobstructive CAD/coronary atherosclerosis due to calcified coronary lesion: Total CAC 363 AU with mild/moderate nonobstructive CAD. Free of angina pectoris No sublingual nitroglycerin tablets since last office encounter Continue aspirin and statin therapy Reemphasized importance of secondary prevention. In the past did not tolerate metoprolol due to feeling tired/fatigued.  Generalized tired fatigue: Likely due to noncardiac cause. Echocardiogram: Preserved LVEF, no significant valvular heart disease. Coronary CTA plus FFR: No significant hemodynamic lesions. Patient has appointment with PCP coming up: Recommended having noncardiac causes of generalized tired and fatigue evaluated (check TSH, B12, vitamin D levels, hemoglobin, being evaluated for sleep apnea, etc.). Clinically I suspect that he would benefit from sleep study and is not sleeping well, snoring, waking up not well rested.  He denies morning headaches or dry mouth upon awakening.  I will defer sleep study evaluation/referral to PCP for now.  Mixed hyperlipidemia:  Currently on atorvastatin.   He denies myalgia or other side effects. Most recent lipids dated 05/28/2021 reviewed as noted above. Currently managed by primary care provider.  As part of today's encounter reviewed management of at  least 2 chronic comorbid conditions, reviewed external labs available in Care Everywhere independently reviewed and noted above for further reference, reviewed prior echocardiogram and coronary CTA results as part of medical decision-making process, and ordered additional diagnostic testing for follow-up.  Like to see him back in 1 year or sooner if change in clinical status.  Have asked him to follow-up approximately a month after his well visit so that the labs are recent EKG reviewed independently for reference.  FINAL MEDICATION LIST END OF ENCOUNTER: No orders of the defined types were placed in this encounter.   Current Outpatient Medications:    acetaminophen (TYLENOL) 500 MG tablet, Take 500 mg by mouth every 6 (six) hours as needed (pain). , Disp: , Rfl:    Ascorbic Acid (VITAMIN C PO), Take 1 tablet by mouth every other day. Alternate with vitamin b complex, Disp: , Rfl:    aspirin 81 MG tablet, Take 81 mg by mouth as needed., Disp: , Rfl:    atorvastatin (LIPITOR) 20 MG tablet, TAKE 1 TABLET(20 MG) BY MOUTH AT BEDTIME, Disp: 90 tablet, Rfl: 3   B Complex-C (B-COMPLEX WITH VITAMIN C) tablet, Take 1 tablet by mouth every other day. Alternate  with vitamin c, Disp: , Rfl:    Biotin 10000 MCG TABS, Take by mouth., Disp: , Rfl:    Cholecalciferol (D-3-5) 125 MCG (5000 UT) capsule, Take 5,000 Units by mouth daily., Disp: , Rfl:    co-enzyme Q-10 30 MG capsule, Take 30 mg by mouth 3 (three) times daily., Disp: , Rfl:    Multiple Vitamin (MULITIVITAMIN WITH MINERALS) TABS, Take 1 tablet by mouth daily., Disp: , Rfl:    nitroGLYCERIN (NITROSTAT) 0.4 MG SL tablet, PLACE 1 TABLET UNDER THE TONGUE EVERY 5 MINGUTES AS NEEDED FOR CHEST PAIN. AFTER 2 TABLETS FIVE MINUTES APART GO TO NEAREST ER VIA EMS., Disp: 25 tablet, Rfl: 1   tamsulosin (FLOMAX) 0.4 MG CAPS capsule, Take 0.4 mg by mouth every evening. , Disp: , Rfl:   Orders Placed This Encounter  Procedures   EKG 12-Lead    There are no  Patient Instructions on file for this visit.   --Continue cardiac medications as reconciled in final medication list. --Return in about 1 year (around 11/27/2022) for Follow up Non-Obstructive CAD. . Or sooner if needed. --Continue follow-up with your primary care physician regarding the management of your other chronic comorbid conditions.  Patient's questions and concerns were addressed to his satisfaction. He voices understanding of the instructions provided during this encounter.   This note was created using a voice recognition software as a result there may be grammatical errors inadvertently enclosed that do not reflect the nature of this encounter. Every attempt is made to correct such errors.  Rex Kras, [place], [facility]  Pager: [phone] Office: [phone]

## 2022-04-24 ENCOUNTER — Other Ambulatory Visit: Payer: Self-pay | Admitting: Cardiology

## 2022-04-24 DIAGNOSIS — I2510 Atherosclerotic heart disease of native coronary artery without angina pectoris: Secondary | ICD-10-CM

## 2022-08-16 ENCOUNTER — Ambulatory Visit (INDEPENDENT_AMBULATORY_CARE_PROVIDER_SITE_OTHER): Payer: No Typology Code available for payment source

## 2022-08-16 ENCOUNTER — Encounter: Payer: Self-pay | Admitting: Orthopedic Surgery

## 2022-08-16 ENCOUNTER — Ambulatory Visit (INDEPENDENT_AMBULATORY_CARE_PROVIDER_SITE_OTHER): Payer: No Typology Code available for payment source | Admitting: Orthopedic Surgery

## 2022-08-16 DIAGNOSIS — M79604 Pain in right leg: Secondary | ICD-10-CM | POA: Diagnosis not present

## 2022-08-16 DIAGNOSIS — M79605 Pain in left leg: Secondary | ICD-10-CM

## 2022-08-16 DIAGNOSIS — M25551 Pain in right hip: Secondary | ICD-10-CM

## 2022-08-16 DIAGNOSIS — M25552 Pain in left hip: Secondary | ICD-10-CM

## 2022-08-16 DIAGNOSIS — I2510 Atherosclerotic heart disease of native coronary artery without angina pectoris: Secondary | ICD-10-CM | POA: Diagnosis not present

## 2022-08-16 DIAGNOSIS — I2584 Coronary atherosclerosis due to calcified coronary lesion: Secondary | ICD-10-CM

## 2022-08-16 NOTE — Progress Notes (Signed)
Office Visit Note   Patient: Vincent Jensen           Date of Birth: 27-Jan-1941           MRN: [id number] Visit Date: 08/16/2022 Requested by: Reynold Bowen, MD [place],  [place] [id number] PCP: Reynold Bowen, MD  Subjective: Chief Complaint  Patient presents with   Other     Follow up hip pain    HPI: Vincent Jensen is a 81 y.o. male who presents to the office reporting left hip and back pain.  Started several months ago.  Patient states that he may have "abused my hips".  He is very active and plays soccer.  Has not really played too much soccer over the past 6 weeks due to SI joint pain and lateral hip pain on the left-hand side.  Denies any instability.  Does not report pain below the knees.  Takes over-the-counter medication.  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              ROS: All systems reviewed are negative as they relate to the chief complaint within the history of present illness.  Patient denies fevers or chills.  Assessment & Plan: Visit Diagnoses:  1. Bilateral hip pain   2. Bilateral leg pain     Plan: Impression is left hip pain which may be coming from the back and or hip.  Plan at this time is MRI lumbar spine to evaluate low back pain and left-sided radiculopathy.  Also would like to get a bone scan of the pelvis to evaluate possible left acetabular component loosening as well as CT scan of the left hip to evaluate the bone prosthetic interface on both the femur and the acetabulum.  Follow-up after the studies.  Follow-Up Instructions: No follow-ups on file.   Orders:  Orders Placed This Encounter  Procedures   XR HIPS BILAT W OR W/O PELVIS 3-4 VIEWS   XR Lumbar Spine 2-3 Views   No orders of the defined types were placed in this encounter.     Procedures: No procedures performed   Clinical Data: No additional findings.  Objective: Vital Signs: There were no vitals taken for this  visit.  Physical Exam:  Constitutional: Patient appears well-developed HEENT:  Head: Normocephalic Eyes:EOM are normal Neck: Normal range of motion Cardiovascular: Normal rate Pulmonary/chest: Effort normal Neurologic: Patient is alert Skin: Skin is warm Psychiatric: Patient has normal mood and affect  Ortho Exam: Ortho exam demonstrates no groin pain on the left or right with internal/external rotation of the leg.  Has excellent hip flexion abduction adduction strength bilaterally.  No nerve root tension signs.  No paresthesias L1 S1 bilaterally.  Does have a little bit of tenderness around the lower aspect of the SI joint on the left compared to the right.  No discrete SI joint sciatic notch tenderness.  No real trochanteric tenderness is noted on the left or right-hand side.  Gait is normal with no Trendelenburg type gait noted.  Specialty Comments:  No specialty comments available.  Imaging: XR HIPS BILAT W OR W/O PELVIS 3-4 VIEWS  Result Date: 08/16/2022 AP pelvis lateral bilateral hips reviewed.  Bilateral total hip prostheses are present.  Femoral stems on both sides appear well fixed without any pedestal formation or lucencies around the implant.  On the left-hand side recut position appears slightly more vertical compared to radiographs from 2006.  No lucencies around the  acetabular shell.  Both hips are located.  No acute fracture.  XR Lumbar Spine 2-3 Views  Result Date: 08/16/2022 AP lateral radiographs lumbar spine reviewed.  Scoliosis is present.  Facet arthritis also present with degenerative disc disease throughout the lumbar spine and into the thoracolumbar junction.  Overall coronal alignment remains balanced.    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   Cyst of left kidney 04/12/2012   Osteoarthrosis involving shoulder region 02/09/2012   Aplastic anemia (HCC) 09/29/2011   Paroxysmal nocturnal hemoglobinuria (PNH) (HCC) 08/12/2011   HYPERLIPIDEMIA 05/22/2009   ASTHMA W/ COPD 05/22/2009   Past Medical History:  Diagnosis Date   Aplastic anemia (HCC)    Arthritis    Diverticulosis    History of blood transfusion    Hyperlipidemia    PNH (paroxysmal nocturnal hemoglobinuria) (HCC)    Prostate nodule - left lobe 08/29/2013   DRE 08/29/2013     Family History  Problem Relation Age of Onset   Myasthenia gravis Mother    Cancer Father    Heart disease Maternal Aunt    Heart disease Maternal Uncle     Past Surgical History:  Procedure Laterality Date   APPENDECTOMY     COLONOSCOPY     JOINT REPLACEMENT     KNEE SURGERY Bilateral    knee replacement surg/Bil   SHOULDER ARTHROSCOPY Left    left shoulder /replacement surg   TONSILLECTOMY     TOTAL HIP ARTHROPLASTY Bilateral    Bil   TOTAL SHOULDER ARTHROPLASTY Right 09/12/2014   Procedure: RIGHT TOTAL SHOULDER ARTHROPLASTY;  Surgeon: Senaida Lange, MD;  Location: [facility];  Service: Orthopedics;  Laterality: Right;   Social History   Occupational History   Not on file  Tobacco Use   Smoking status: Former    Packs/day: 1.00    Years: 10.00    Total pack years: 10.00    Types: Cigarettes    Quit date: 01/02/1972    Years since quitting: 50.6   Smokeless tobacco: Never  Vaping Use   Vaping Use: Never used  Substance and Sexual Activity   Alcohol use: No   Drug use: No   Sexual activity: Not on file

## 2022-08-27 ENCOUNTER — Encounter (HOSPITAL_COMMUNITY)
Admission: RE | Admit: 2022-08-27 | Discharge: 2022-08-27 | Disposition: A | Discharge status: Home / Self Care | Payer: No Typology Code available for payment source | Source: Ambulatory Visit | Attending: Orthopedic Surgery | Admitting: Orthopedic Surgery

## 2022-08-27 DIAGNOSIS — M25552 Pain in left hip: Secondary | ICD-10-CM | POA: Diagnosis present

## 2022-08-27 DIAGNOSIS — M79605 Pain in left leg: Secondary | ICD-10-CM | POA: Diagnosis present

## 2022-08-27 DIAGNOSIS — M25551 Pain in right hip: Secondary | ICD-10-CM | POA: Insufficient documentation

## 2022-08-27 DIAGNOSIS — M79604 Pain in right leg: Secondary | ICD-10-CM | POA: Insufficient documentation

## 2022-08-27 MED ORDER — TECHNETIUM TC 99M MEDRONATE IV KIT
20.0000 | PACK | Freq: Once | INTRAVENOUS | Status: AC | PRN
  Administered 2022-08-27: 22 via INTRAVENOUS

## 2022-08-29 ENCOUNTER — Ambulatory Visit
Admission: RE | Admit: 2022-08-29 | Discharge: 2022-08-29 | Disposition: A | Discharge status: Home / Self Care | Payer: No Typology Code available for payment source | Source: Ambulatory Visit | Attending: Orthopedic Surgery | Admitting: Orthopedic Surgery

## 2022-08-29 DIAGNOSIS — M79604 Pain in right leg: Secondary | ICD-10-CM

## 2022-08-30 ENCOUNTER — Ambulatory Visit
Admission: RE | Admit: 2022-08-30 | Discharge: 2022-08-30 | Disposition: A | Discharge status: Home / Self Care | Payer: No Typology Code available for payment source | Source: Ambulatory Visit | Attending: Orthopedic Surgery | Admitting: Orthopedic Surgery

## 2022-08-30 ENCOUNTER — Other Ambulatory Visit: Payer: Medicare Other

## 2022-08-30 DIAGNOSIS — M25551 Pain in right hip: Secondary | ICD-10-CM

## 2022-08-31 ENCOUNTER — Other Ambulatory Visit: Payer: Self-pay

## 2022-08-31 DIAGNOSIS — M79604 Pain in right leg: Secondary | ICD-10-CM

## 2022-08-31 NOTE — Progress Notes (Signed)
Hey Lauren.  I called Peyton Najjar and.  Looks like it is his back.  Discussed this with him.  Can you get him set up to see Estrellita Ludwig.  Essentially he has neurogenic claudication and supposedly he will not get better with injections but I will let him talk to [facility] about it and see what he thinks his stenosis is pretty tight.  Fortunately the hip looks good

## 2022-08-31 NOTE — Progress Notes (Signed)
Please cancel his appointment with me also for 1127 thanks

## 2022-09-06 ENCOUNTER — Ambulatory Visit: Payer: Medicare Other | Admitting: Orthopedic Surgery

## 2022-09-09 ENCOUNTER — Encounter: Payer: Self-pay | Admitting: Orthopedic Surgery

## 2022-09-09 ENCOUNTER — Ambulatory Visit (INDEPENDENT_AMBULATORY_CARE_PROVIDER_SITE_OTHER): Payer: No Typology Code available for payment source | Admitting: Orthopedic Surgery

## 2022-09-09 ENCOUNTER — Ambulatory Visit (INDEPENDENT_AMBULATORY_CARE_PROVIDER_SITE_OTHER): Payer: No Typology Code available for payment source

## 2022-09-09 VITALS — BP 113/71 | HR 69 | Ht 68.0 in | Wt 166.0 lb

## 2022-09-09 DIAGNOSIS — M25552 Pain in left hip: Secondary | ICD-10-CM

## 2022-09-09 DIAGNOSIS — M25551 Pain in right hip: Secondary | ICD-10-CM

## 2022-09-09 DIAGNOSIS — M79605 Pain in left leg: Secondary | ICD-10-CM

## 2022-09-09 DIAGNOSIS — M79604 Pain in right leg: Secondary | ICD-10-CM | POA: Diagnosis not present

## 2022-09-09 NOTE — Progress Notes (Signed)
Orthopedic Spine Surgery Office Note  Assessment: Patient is a 81 y.o. male with degenerative scoliosis and pain in the bilateral buttocks. Unclear the exact site of stenosis that is causing this symptoms since has foraminal stenosis at multiple levels and central stenosis at L3/4.    Plan: -Explained that initially conservative treatment is tried to see what relief can be gained with these non-surgical treatment modalities. Discussed that the conservative treatments include:  -activity modification  -physical therapy  -over the counter pain medications  -gabapentin  -steroid injections -Patient wants to try core strengthening, tylenol, and periodic NSAID use -Discussed possible injection to L3/4 for diagnostic and therapeutic purposes -He is going to pay attention to his symptoms and let me know at our next visit some of the things that make it better/worse -Patient should return to office in 8 weeks, repeat x-rays of lumbar spine at next visit: none   Patient expressed understanding of the plan and all questions were answered to the patient's satisfaction.   ___________________________________________________________________________   History:  Patient is a 81 y.o. male who presents today for lumbar spine. Patient was previously very active. Had been playing soccer through July 2023. Within the last few months has noted pain in his bilateral buttocks. Can feel dull or like a soreness. He feels it on a daily basis. It is not so painful that he cannot do his daily activities but it is annoying. He has tried activity modification and has stopped soccer. Pain has persisted though. Does not radiate distal to the buttock. Denies numbness and paresthesias.    Weakness: denies Symptoms of imbalance: denies Paresthesias and numbness: denies Bowel or bladder incontinence: denies Saddle anesthesia: denies  Symptoms:  -leg cramping: denies  -leg heaviness: denies  -with improvement upon  flexion of back: unsure  Treatments tried: activity modification   Review of systems: Denies fevers and chills, night sweats, unexplained weight loss, history of cancer. Has had pain that wakes him at night  Past medical history: HLD Diverticulosis  Allergies: NKDA  Past surgical history:  Bilateral THA Bilateral TKA Shoulder arthroplasty  Social history: Denies use of nicotine product (smoking, vaping, patches, smokeless) Alcohol use: denies Denies recreational drug use   Physical Exam:  General: no acute distress, appears stated age Neurologic: alert, answering questions appropriately, following commands Respiratory: unlabored breathing on room air, symmetric chest rise Psychiatric: appropriate affect, normal cadence to speech Vascular: palpable dorsalis pedis pulses bilaterally, feet warm and well perfused   MSK (spine):  -Strength exam      Left  Right EHL    5/5  5/5 TA    5/5  5/5 GSC    5/5  5/5 Knee extension  5/5  5/5 Hip flexion   5/5  5/5  -Sensory exam    Sensation intact to light touch in L3-S1 nerve distributions of bilateral lower extremities  -Achilles DTR: 1/4 on the left, 1/4 on the right -Patellar tendon DTR: 1/4 on the left, 1/4 on the right  Left hip exam: no pain through range of motion, negative stinchfield Right hip exam: no pain through range of motion, negative stinchfield  -Straight leg raise: negative bilaterally -Clonus: no beats bilaterally  Imaging: XR of the lumbar spine from 09/09/2022 and 08/16/2022 was independently reviewed and interpreted, showing lumbar scoliosis. Disc height loss at multiple levels. Lateral listhesis at L2/3 and L3/4. No fracture or dislocation. No evidence of instability on flexion/extension.   MRI of the lumbar spine from 08/29/2022 was independently reviewed and  interpreted, showing central and lateral recess stenosis at L3/4. Facet cyst at L4/5 on the right. Lateral recess stenosis at L2/3 and  L1/2. Left foraminal stenosis at L3/4 and L4/5. Right L1/2, L2/3, and L3/4 foraminal stenosis.    Patient name: Vincent Jensen Patient MRN: [id number] Date of visit: 09/09/22

## 2022-11-04 ENCOUNTER — Ambulatory Visit (INDEPENDENT_AMBULATORY_CARE_PROVIDER_SITE_OTHER): Payer: No Typology Code available for payment source | Admitting: Orthopedic Surgery

## 2022-11-04 DIAGNOSIS — M48062 Spinal stenosis, lumbar region with neurogenic claudication: Secondary | ICD-10-CM | POA: Diagnosis not present

## 2022-11-04 NOTE — Progress Notes (Signed)
Orthopedic Spine Surgery Office Note  Assessment: Patient is a 82 y.o. male with lumbar degenerative scoliosis with multiple levels of stenosis. His most significant radiographic central and lateral recess stenosis is at L3/4   Plan: -Explained that initially conservative treatment is tried as a significant number of patients may experience relief with these treatment modalities. Discussed that the conservative treatments include:  -activity modification  -physical therapy  -over the counter pain medications  -lumbar steroid injections -Patient has tried activity modification, tylenol -Recommended continue with tylenol as needed, core strengthening -If his pain becomes more regular, would recommend an L3/4 ESI since his symptoms are not entirely consistent with neurogenic claudication. If he calls the office, will arrange for this -Told him that his lumbar scoliosis and stenosis will not worsen with playing soccer, so he should get back to it if he can tolerate it -Patient should return to office on an as needed basis   Patient expressed understanding of the plan and all questions were answered to the patient's satisfaction.   ___________________________________________________________________________  History: Patient is a 82 y.o. male who has been previously seen in the office for symptoms that were possibly consistent with lumbar stenosis. He is still having pain in his back and into his buttocks bilaterally. He feels the pain is sporadic and not throughout the day. The buttock pain often seems related to activity but not all the time. His back pain is worse at the end of the day. Tylenol is helping. He has not gotten back to soccer for fear of injuring things worse. Has not noticed any weakness. Denies paresthesias and numbness.   Previous treatments: activity modification, tylenol  COPY OF FIRST NOTE  Patient is a 82 y.o. male who presents today for lumbar spine. Patient was  previously very active. Had been playing soccer through July 2023. Within the last few months has noted pain in his bilateral buttocks. Can feel dull or like a soreness. He feels it on a daily basis. It is not so painful that he cannot do his daily activities but it is annoying. He has tried activity modification and has stopped soccer. Pain has persisted though. Does not radiate distal to the buttock. Denies numbness and paresthesias.      Weakness: denies Symptoms of imbalance: denies Paresthesias and numbness: denies Bowel or bladder incontinence: denies Saddle anesthesia: denies   Symptoms:             -leg cramping: denies             -leg heaviness: denies             -with improvement upon flexion of back: unsure  END OF COPY  Physical Exam:  General: no acute distress, appears stated age Neurologic: alert, answering questions appropriately, following commands Respiratory: unlabored breathing on room air, symmetric chest rise Psychiatric: appropriate affect, normal cadence to speech   MSK (spine):  -Strength exam      Left  Right EHL    5/5  5/5 TA    5/5  5/5 GSC    5/5  5/5 Knee extension  5/5  5/5 Hip flexion   5/5  5/5  -Sensory exam    Sensation intact to light touch in L3-S1 nerve distributions of bilateral lower extremities  -Achilles DTR: 1/4 on the left, 1/4 on the right -Patellar tendon DTR: 1/4 on the left, 1/4 on the right  -Straight leg raise: negative -Contralateral straight leg raise: negative  -Clonus: no beats bilaterally  Imaging: XR of the lumbar spine from 09/09/2022 and 08/16/2022 was previously independently reviewed and interpreted, showing lumbar scoliosis. Disc height loss at multiple levels. Lateral listhesis at L2/3 and L3/4. No fracture or dislocation. No evidence of instability on flexion/extension.    MRI of the lumbar spine from 08/29/2022 was previously independently reviewed and interpreted, showing central and lateral recess  stenosis at L3/4. Facet cyst at L4/5 on the right. Lateral recess stenosis at L2/3 and L1/2. Left foraminal stenosis at L3/4 and L4/5. Right L1/2, L2/3, and L3/4 foraminal stenosis.    Patient name: Vincent Jensen Patient MRN: [id number] Date of visit: 11/04/22

## 2022-11-25 ENCOUNTER — Encounter: Payer: Self-pay | Admitting: Cardiology

## 2022-11-25 ENCOUNTER — Ambulatory Visit: Payer: Medicare Other | Admitting: Cardiology

## 2022-11-25 VITALS — BP 132/79 | HR 61 | Resp 16 | Ht 67.0 in | Wt 160.8 lb

## 2022-11-25 DIAGNOSIS — Z87891 Personal history of nicotine dependence: Secondary | ICD-10-CM

## 2022-11-25 DIAGNOSIS — E782 Mixed hyperlipidemia: Secondary | ICD-10-CM

## 2022-11-25 DIAGNOSIS — I2510 Atherosclerotic heart disease of native coronary artery without angina pectoris: Secondary | ICD-10-CM

## 2022-11-25 DIAGNOSIS — R5383 Other fatigue: Secondary | ICD-10-CM

## 2022-11-25 NOTE — Progress Notes (Signed)
ID:  Vincent Jensen, [place] [birth year], MRN [id number]  PCP:  Reynold Bowen, MD  Cardiologist:  Rex Kras, DO, [facility] (established care 07/31/2020)  Date: 11/25/22 Last Office Visit: 11/27/2021  Chief Complaint  Patient presents with   Coronary Artery Disease   Follow-up    1 year     HPI  Vincent Jensen is a 82 y.o. male whose past medical history and cardiovascular risk factors include: Moderate coronary artery calcification, nonobstructive CAD per coronary CT FFR, hyperlipidemia, former smoker, advanced age.  Initially referred to the practice for evaluation of coronary artery calcification and throat tightness.  Since establishing care he has undergone ischemic workup including a coronary CTA plus FFR which noted moderate CAC and nonobstructive disease.  He presents today for 1 year follow-up visit.  Over the last 1 year he denies anginal discomfort or heart failure symptoms.  Because of his bunion pain he is no longer playing soccer and has difficulty walking.  He plans to have it surgically remediated in the upcoming months.  He also had labs with PCP earlier this week which are not resulted in [place].  ALLERGIES: No Known Allergies  MEDICATION LIST PRIOR TO VISIT: Current Meds  Medication Sig   acetaminophen (TYLENOL) 500 MG tablet Take 500 mg by mouth every 6 (six) hours as needed (pain).    Ascorbic Acid (VITAMIN C PO) Take 1 tablet by mouth every other day. Alternate with vitamin b complex   aspirin 81 MG tablet Take 81 mg by mouth as needed.   atorvastatin (LIPITOR) 20 MG tablet TAKE 1 TABLET(20 MG) BY MOUTH DAILY   Cholecalciferol (D-3-5) 125 MCG (5000 UT) capsule Take 5,000 Units by mouth daily.   co-enzyme Q-10 30 MG capsule Take 30 mg by mouth 3 (three) times daily.   Multiple Vitamin (MULITIVITAMIN WITH MINERALS) TABS Take 1 tablet by mouth daily.   nitroGLYCERIN (NITROSTAT) 0.4 MG SL tablet PLACE 1 TABLET UNDER THE TONGUE EVERY 5 MINGUTES AS NEEDED  FOR CHEST PAIN. AFTER 2 TABLETS FIVE MINUTES APART GO TO NEAREST ER VIA EMS.   tamsulosin (FLOMAX) 0.4 MG CAPS capsule Take 0.4 mg by mouth every evening.      PAST MEDICAL HISTORY: Past Medical History:  Diagnosis Date   Aplastic anemia ([place])    Arthritis    Diverticulosis    History of blood transfusion    Hyperlipidemia    PNH (paroxysmal nocturnal hemoglobinuria) (HCC)    Prostate nodule - left lobe 08/29/2013   DRE 08/29/2013     PAST SURGICAL HISTORY: Past Surgical History:  Procedure Laterality Date   APPENDECTOMY     COLONOSCOPY     JOINT REPLACEMENT     KNEE SURGERY Bilateral    knee replacement surg/Bil   SHOULDER ARTHROSCOPY Left    left shoulder /replacement surg   TONSILLECTOMY     TOTAL HIP ARTHROPLASTY Bilateral    Bil   TOTAL SHOULDER ARTHROPLASTY Right 09/12/2014   Procedure: RIGHT TOTAL SHOULDER ARTHROPLASTY;  Surgeon: Marin Shutter, MD;  Location: [place];  Service: Orthopedics;  Laterality: Right;    FAMILY HISTORY: The patient family history includes Cancer in his father; Heart disease in his maternal aunt and maternal uncle; Myasthenia gravis in his mother.  SOCIAL HISTORY:  The patient  reports that he quit smoking about 50 years ago. His smoking use included cigarettes. He has a 10.00 pack-year smoking history. He has never used smokeless tobacco. He reports that he does not drink alcohol and  does not use drugs.  REVIEW OF SYSTEMS: Review of Systems  Cardiovascular:  Negative for chest pain, claudication, dyspnea on exertion, irregular heartbeat, leg swelling, near-syncope, orthopnea, palpitations, paroxysmal nocturnal dyspnea and syncope.  Respiratory:  Positive for snoring. Negative for shortness of breath.   Hematologic/Lymphatic: Negative for bleeding problem.  Musculoskeletal:  Negative for muscle cramps and myalgias.  Neurological:  Negative for dizziness and light-headedness.    PHYSICAL EXAM:    11/25/2022   10:26 AM 09/09/2022    3:38  PM 11/27/2021   10:32 AM  Vitals with BMI  Height 5' 7"$  5' 8"$  5' 8"$   Weight 160 lbs 13 oz 166 lbs 168 lbs 13 oz  BMI 25.18 Q000111Q 123456  Systolic Q000111Q 123456 123456  Diastolic 79 71 70  Pulse 61 69 74   Physical Exam  Constitutional: No distress.  Age appropriate, hemodynamically stable.   Neck: No JVD present.  Cardiovascular: Normal rate, regular rhythm, S1 normal, S2 normal, intact distal pulses and normal pulses. Exam reveals no gallop, no S3 and no S4.  No murmur heard. Pulmonary/Chest: Effort normal and breath sounds normal. No stridor. He has no wheezes. He has no rales.  Abdominal: Soft. Bowel sounds are normal. He exhibits no distension. There is no abdominal tenderness.  Musculoskeletal:        General: No edema.     Cervical back: Neck supple.  Neurological: He is alert and oriented to person, place, and time. He has intact cranial nerves (2-12).  Skin: Skin is warm and moist.   CTA chest PE w/ or w/o contrast:  12/08/2011:  Scattered coronary artery calcifications seen.   CARDIAC DATABASE: EKG: 11/25/2022: Sinus rhythm, 62 bpm, left anterior fascicular block,  without underlying injury pattern.  No significant change compared to prior ECG.  Echocardiogram: 11/19/2020: Left ventricle cavity is normal in size. Mild concentric hypertrophy of the left ventricle. Normal global wall motion. Normal LV systolic function with EF 55%.  Doppler evidence of grade I (impaired) diastolic dysfunction, normal LAP. Calculated EF 55%. Left atrial cavity is mildly dilated. Right atrial cavity is mildly dilated. Mild to moderate mitral regurgitation. Mild tricuspid regurgitation. Estimated pulmonary artery systolic pressure 31 mmHg.   Stress Testing: No results found for this or any previous visit from the past 1095 days.  08/13/2020 CCTA:  Coronary calcium score of 363 AU. This was 52th percentile for age and sex matched control.  Normal coronary origin with right dominance.  CAD-RADS = 3.  CT Mild stenosis in the proximal to mid LAD, Moderate stenosis is first diagonal branch, Mild stenosis in mid LCX, Moderate stenosis in mid RCA.  Mid ascending aorta measures 39m, upper limit of normal, at the level of the PA bifurcation.  Mild Aortic atherosclerosis.  CT FFR analysis showed no significant stenosis.  RECOMMENDATIONS: CAD-RADS 3: Moderate stenosis. Consider symptom-guided anti-ischemic pharmacotherapy as well as risk factor modification per guideline directed care.  Heart Catheterization: None  LABORATORY DATA: External Labs: Collected: 07/14/2020 Creatinine 144mdL. eGFR: 72.3 mL/min per 1.73 m Lipid profile: Total cholesterol 190, triglycerides 80, HDL 45, LDL 129, non-HDL145 Hemoglobin A1c: 5  External Labs: Collected: 05/28/2021 available in Care Everywhere. Sodium 140, potassium 4.5, chloride 107, bicarb 24, BUN 15, creatinine 0.8. Morning cortisol 9.2. Hemoglobin 11.9 g/dL, hematocrit 36.4% Total cholesterol 144, triglycerides 88, HDL 47, LDL 79, non-HDL 97. AST 17, ALT 17, alkaline phosphatase 99. TSH 2.56. A1c 5.5  Reviewed date:05/31/2022 04:17:18 PM Interpretation: Performing Lab: Notes/Report:  A1C 5.1  VITAMIN D Reviewed date:06/03/2022 08:09:30 AM Interpretation: Performing Lab: Notes/Report:  VITAMIN D 37.4 30.0 - 100.0 ng/mL    PSA Reviewed date:06/03/2022 08:09:22 AM Interpretation: Performing Lab: Notes/Report:  PSA 1.489 0.000 - 4.000 ng/ml Chemiluminescent Microparticle Immunoassay (CMIA)  LIPID Reviewed date:06/01/2022 01:03:21 PM Interpretation: Performing Lab: Notes/Report:  CHOLESTEROL 152 100 - 200 mg/dl    TRIGLYCERIDES 78 30 - 149 mg/dl    HDL 48 40 - 60 mg/dl    LDL 88 < mg/dl    CHOL/HDL RATIO 3.2 < RATIO    LDL/HDL RATIO 1.8 1.7 - 2.5    NON-HDL 104.0 <    CBC    IMPRESSION:    ICD-10-CM   1. Coronary atherosclerosis due to calcified coronary lesion  I25.10 EKG 12-Lead   I25.84     2. Nonobstructive  atherosclerosis of coronary artery  I25.10 EKG 12-Lead    3. Tiredness  R53.83     4. Mixed hyperlipidemia  E78.2     5. Former smoker  Z87.891        RECOMMENDATIONS: Vincent Jensen is a 82 y.o. male whose past medical history and cardiac risk factors include: Moderate coronary artery calcification, nonobstructive CAD per coronary CT FFR, hyperlipidemia, former smoker, advanced age.  Coronary atherosclerosis due to calcified coronary lesion Nonobstructive atherosclerosis of coronary artery Total CAC 363, mild/moderate risk for CAD. Denies anginal discomfort. No use of sublingual nitroglycerin tablets since the last office visit. Continue aspirin and statin therapy. No additional diagnostic testing warranted at this time. Reeducated Importance of modifiable cardiovascular risk factor management Given his snoring, generalized tired and fatigue I recommended undergoing sleep study and nursing sleep provider in the past.  This is still pending.  But he is likely going to entertain the idea and see someone in consult.  He will discuss it further with PCP.  Mixed hyperlipidemia Currently on atorvastatin.   He denies myalgia or other side effects. Outside labs from August 2023 independently reviewed. Would recommend a goal LDL <70 mg/dL. Currently managed by primary care provider.  FINAL MEDICATION LIST END OF ENCOUNTER: No orders of the defined types were placed in this encounter.   Current Outpatient Medications:    acetaminophen (TYLENOL) 500 MG tablet, Take 500 mg by mouth every 6 (six) hours as needed (pain). , Disp: , Rfl:    Ascorbic Acid (VITAMIN C PO), Take 1 tablet by mouth every other day. Alternate with vitamin b complex, Disp: , Rfl:    aspirin 81 MG tablet, Take 81 mg by mouth as needed., Disp: , Rfl:    atorvastatin (LIPITOR) 20 MG tablet, TAKE 1 TABLET(20 MG) BY MOUTH DAILY, Disp: 90 tablet, Rfl: 3   Cholecalciferol (D-3-5) 125 MCG (5000 UT) capsule, Take 5,000  Units by mouth daily., Disp: , Rfl:    co-enzyme Q-10 30 MG capsule, Take 30 mg by mouth 3 (three) times daily., Disp: , Rfl:    Multiple Vitamin (MULITIVITAMIN WITH MINERALS) TABS, Take 1 tablet by mouth daily., Disp: , Rfl:    nitroGLYCERIN (NITROSTAT) 0.4 MG SL tablet, PLACE 1 TABLET UNDER THE TONGUE EVERY 5 MINGUTES AS NEEDED FOR CHEST PAIN. AFTER 2 TABLETS FIVE MINUTES APART GO TO NEAREST ER VIA EMS., Disp: 25 tablet, Rfl: 1   tamsulosin (FLOMAX) 0.4 MG CAPS capsule, Take 0.4 mg by mouth every evening. , Disp: , Rfl:   Orders Placed This Encounter  Procedures   EKG 12-Lead    There are no Patient Instructions on file for this visit.   --  Continue cardiac medications as reconciled in final medication list. --Return in about 1 year (around 11/26/2023) for Annual follow up visit, CAD. Or sooner if needed. --Continue follow-up with your primary care physician regarding the management of your other chronic comorbid conditions.  Patient's questions and concerns were addressed to his satisfaction. He voices understanding of the instructions provided during this encounter.   This note was created using a voice recognition software as a result there may be grammatical errors inadvertently enclosed that do not reflect the nature of this encounter. Every attempt is made to correct such errors.  Rex Kras, [place], [facility]  Pager: [phone] Office: [phone]

## 2023-01-05 ENCOUNTER — Telehealth: Payer: Self-pay | Admitting: *Deleted

## 2023-01-05 NOTE — Telephone Encounter (Signed)
   Pre-operative Risk Assessment    Patient Name: Vincent Jensen  DOB: 1941/03/26 MRN: [id number]      Request for Surgical Clearance    Procedure:   RIGHT HALLUX MPJ ATHRODESIS, SILVER BUNIONECTOMY, 36 MT HEAD EXCISION, OPEN REDUCTION OF MTP JOIN DILOCATION; 2-5 HAMMERTOE CORRECTIONS  Date of Surgery:  Clearance TBD                                 Surgeon:  DR. Jenny Reichmann HEWITT Surgeon's Group or Practice Name:  [facility] Phone number:  [phone] Fax number:  [phone]   Type of Clearance Requested:   - Medical  - Pharmacy:  Hold Aspirin NOT INDICATED HOW LONG   Type of Anesthesia:  General    Additional requests/questions:    Astrid Divine   01/05/2023, 9:42 AM

## 2023-01-05 NOTE — Telephone Encounter (Signed)
Patient is followed by [facility] cardiology, not [facility].  I will forward to requesting provider and remove from preop pool.  Emmaline Life, NP-C  01/05/2023, 10:53 AM [address] Office [phone] Fax [phone]

## 2023-03-02 ENCOUNTER — Telehealth: Payer: Self-pay

## 2023-03-02 NOTE — Telephone Encounter (Signed)
Called patient about surgical clearance. If patient calls back please transfer to [place] or [place].

## 2023-03-14 ENCOUNTER — Telehealth: Payer: Self-pay

## 2023-03-14 NOTE — Telephone Encounter (Signed)
error 

## 2023-05-07 ENCOUNTER — Other Ambulatory Visit: Payer: Self-pay | Admitting: Cardiology

## 2023-05-07 DIAGNOSIS — I2510 Atherosclerotic heart disease of native coronary artery without angina pectoris: Secondary | ICD-10-CM

## 2023-07-11 ENCOUNTER — Ambulatory Visit
Admission: EM | Admit: 2023-07-11 | Discharge: 2023-07-11 | Disposition: A | Discharge status: Home / Self Care | Payer: No Typology Code available for payment source | Attending: Internal Medicine | Admitting: Internal Medicine

## 2023-07-11 DIAGNOSIS — J069 Acute upper respiratory infection, unspecified: Secondary | ICD-10-CM | POA: Diagnosis not present

## 2023-07-11 DIAGNOSIS — U071 COVID-19: Secondary | ICD-10-CM | POA: Diagnosis not present

## 2023-07-11 DIAGNOSIS — R52 Pain, unspecified: Secondary | ICD-10-CM | POA: Diagnosis not present

## 2023-07-11 DIAGNOSIS — J029 Acute pharyngitis, unspecified: Secondary | ICD-10-CM

## 2023-07-11 LAB — POCT INFLUENZA A/B
Influenza A, POC: NEGATIVE
Influenza B, POC: NEGATIVE

## 2023-07-11 LAB — POCT RAPID STREP A (OFFICE): Rapid Strep A Screen: NEGATIVE

## 2023-07-11 NOTE — Discharge Instructions (Signed)
The clinic will contact you with results of the COVID test done today if positive.  Lots of rest and fluids.  Please follow-up with your PCP in 2 days for recheck.  Please go to the ER if you develop any worsening symptoms.  I hope you feel better soon!

## 2023-07-11 NOTE — ED Provider Notes (Signed)
[facility]    CSN: [id number] Arrival date & time: 07/11/23  0254      History   Chief Complaint No chief complaint on file.   HPI Vincent Jensen is a 82 y.o. male  presents for evaluation of URI symptoms for 3 days. Patient reports associated symptoms of cough, itching, sore throat, chills with tactile fevers. Denies N/V/D, ear pain, body aches, shortness of breath. Patient does not have a hx of asthma. Patient does not have a history of smoking.  Reports daughter has similar symptoms.  Reports 1 negative home COVID test and 1 negative COVID test via [facility].  Pt has taken Advil OTC for symptoms. Pt has no other concerns at this time.   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   Cyst of left kidney 04/12/2012   Osteoarthrosis involving shoulder region 02/09/2012   Aplastic anemia (HCC) 09/29/2011   Paroxysmal nocturnal hemoglobinuria (PNH) (HCC) 08/12/2011   HYPERLIPIDEMIA 05/22/2009   ASTHMA W/ COPD 05/22/2009    Past Surgical History:  Procedure Laterality Date   APPENDECTOMY     COLONOSCOPY     JOINT REPLACEMENT     KNEE SURGERY Bilateral    knee replacement surg/Bil   SHOULDER ARTHROSCOPY Left    left shoulder /replacement surg   TONSILLECTOMY     TOTAL HIP ARTHROPLASTY Bilateral    Bil   TOTAL SHOULDER ARTHROPLASTY Right 09/12/2014   Procedure: RIGHT TOTAL SHOULDER  ARTHROPLASTY;  Surgeon: Senaida Lange, MD;  Location: [facility] OR;  Service: Orthopedics;  Laterality: Right;       Home Medications    Prior to Admission medications   Medication Sig Start Date End Date Taking? Authorizing Provider  Ascorbic Acid (VITAMIN C PO) Take 1 tablet by mouth every other day. Alternate with vitamin b complex   Yes [provider]  aspirin 81 MG tablet Take 81 mg by mouth as needed.   Yes [provider]  atorvastatin (LIPITOR) 20 MG tablet TAKE 1 TABLET BY MOUTH EVERY DAY 05/09/23  Yes Tolia, Sunit, DO  Cholecalciferol (D-3-5) 125 MCG (5000 UT) capsule Take 5,000 Units by mouth daily.   Yes [provider]  co-enzyme Q-10 30 MG capsule Take 30 mg by mouth 3 (three) times daily.   Yes [provider]  Multiple Vitamin (MULITIVITAMIN WITH MINERALS) TABS Take 1 tablet by mouth daily.   Yes [provider]  tamsulosin (FLOMAX) 0.4 MG CAPS capsule Take 0.4 mg by mouth every evening.  08/02/13  Yes [provider]  acetaminophen (TYLENOL) 500 MG tablet Take 500 mg by mouth every 6 (six) hours as needed (pain).     [provider]  nitroGLYCERIN (NITROSTAT) 0.4 MG SL tablet PLACE 1 TABLET UNDER THE TONGUE EVERY 5 MINGUTES AS NEEDED FOR CHEST PAIN. AFTER 2 TABLETS FIVE  MINUTES APART GO TO NEAREST ER VIA EMS. 08/07/20   Tessa Lerner, DO    Family History Family History  Problem Relation Age of Onset   Myasthenia gravis Mother    Cancer Father    Heart disease Maternal Aunt    Heart disease Maternal Uncle     Social History Social History   Tobacco Use   Smoking status: Former    Current packs/day: 0.00    Average packs/day: 1 pack/day for 10.0 years (10.0 ttl pk-yrs)    Types: Cigarettes    Start date: 01/01/1962    Quit date: 01/02/1972    Years since quitting: 51.5   Smokeless tobacco: Never  Vaping Use   Vaping status: Never Used  Substance Use Topics   Alcohol use: No   Drug use: No      Allergies   Patient has no known allergies.   Review of Systems Review of Systems  Constitutional:  Positive for chills and fever.  HENT:  Positive for congestion and sore throat.   Respiratory:  Positive for cough.      Physical Exam Triage Vital Signs ED Triage Vitals  Encounter Vitals Group     BP 07/11/23 0908 108/72     Systolic BP Percentile --      Diastolic BP Percentile --      Pulse Rate 07/11/23 0908 85     Resp 07/11/23 0908 16     Temp 07/11/23 0908 98.6 F (37 C)     Temp Source 07/11/23 0908 Oral     SpO2 07/11/23 0908 96 %     Weight --      Height --      Head Circumference --      Peak Flow --      Pain Score 07/11/23 0912 0     Pain Loc --      Pain Education --      Exclude from Growth Chart --    No data found.  Updated Vital Signs BP 108/72 (BP Location: Right Arm)   Pulse 85   Temp 98.6 F (37 C) (Oral)   Resp 16   SpO2 96%   Visual Acuity Right Eye Distance:   Left Eye Distance:   Bilateral Distance:    Right Eye Near:   Left Eye Near:    Bilateral Near:     Physical Exam Vitals and nursing note reviewed.  Constitutional:      General: He is not in acute distress.    Appearance: Normal appearance. He is not ill-appearing or toxic-appearing.  HENT:     Head: Normocephalic and atraumatic.     Right Ear: Tympanic membrane and ear canal normal.     Left Ear: Tympanic membrane and ear canal normal.     Nose: Congestion present.     Mouth/Throat:     Mouth: Mucous membranes are moist.     Pharynx: Posterior oropharyngeal erythema present.  Eyes:     Pupils: Pupils are equal, round, and reactive to light.  Cardiovascular:     Rate and Rhythm: Normal rate and regular rhythm.     Heart sounds: Normal heart sounds.  Pulmonary:     Effort: Pulmonary effort is normal.     Breath sounds: Normal breath sounds.  Musculoskeletal:     Cervical back: Normal range of motion and neck supple.  Lymphadenopathy:     Cervical: No  cervical adenopathy.  Skin:    General: Skin is warm and  dry.  Neurological:     General: No focal deficit present.     Mental Status: He is alert and oriented to person, place, and time.  Psychiatric:        Mood and Affect: Mood normal.        Behavior: Behavior normal.      UC Treatments / Results  Labs (all labs ordered are listed, but only abnormal results are displayed) Labs Reviewed  SARS CORONAVIRUS 2 (TAT 6-24 HRS)  CULTURE, GROUP A STREP [facility])  POCT RAPID STREP A (OFFICE)  POCT INFLUENZA A/B    EKG   Radiology No results found.  Procedures Procedures (including critical care time)  Medications Ordered in UC Medications - No data to display  Initial Impression / Assessment and Plan / UC Course  I have reviewed the triage vital signs and the nursing notes.  Pertinent labs & imaging results that were available during my care of the patient were reviewed by me and considered in my medical decision making (see chart for details).     Reviewed exam and symptoms with patient.  No red flags.  Negative rapid flu and strep.  COVID and strep PCR and will contact if positive.  Discussed viral illness and symptomatic treatment.  Patient declined Rx Tessalon and will use OTC cough medicine as needed.  PCP follow-up 2 days for recheck.  ER precautions reviewed. Final Clinical Impressions(s) / UC Diagnoses   Final diagnoses:  Body aches  Sore throat  Viral upper respiratory illness     Discharge Instructions      The clinic will contact you with results of the COVID test done today if positive.  Lots of rest and fluids.  Please follow-up with your PCP in 2 days for recheck.  Please go to the ER if you develop any worsening symptoms.  I hope you feel better soon!     ED Prescriptions   None    PDMP not reviewed this encounter.   Radford Pax, NP 07/11/23 [phone]

## 2023-07-11 NOTE — ED Triage Notes (Signed)
Pt presents to UC w/ c/o nasal congestion, sore throat x3 days. Pt reports "feeling warm" yesterday. He reports getting the flu and covid vaccines yesterday and symptoms worsened yesterday afternoon. Pt took 2 home covid tests which were negative. Pt took tylenol last night.

## 2023-07-12 LAB — SARS CORONAVIRUS 2 (TAT 6-24 HRS): SARS Coronavirus 2: POSITIVE — AB

## 2023-07-14 LAB — CULTURE, GROUP A STREP (THRC)

## 2023-07-29 ENCOUNTER — Telehealth: Payer: Self-pay | Admitting: Cardiology

## 2023-07-29 NOTE — Telephone Encounter (Signed)
Daughter Archie Patten) wanted Dr. Odis Hollingshead to know patient had extensive foot surgery on Wednesday.

## 2023-08-01 NOTE — Telephone Encounter (Signed)
I hope it went well - wishing him a speedy recovery.   Tache Bobst Newkirk, DO, [facility]

## 2023-11-24 ENCOUNTER — Ambulatory Visit: Payer: Self-pay | Admitting: Cardiology

## 2023-11-25 ENCOUNTER — Emergency Department (HOSPITAL_BASED_OUTPATIENT_CLINIC_OR_DEPARTMENT_OTHER): Payer: Medicare Other

## 2023-11-25 ENCOUNTER — Ambulatory Visit
Admission: EM | Admit: 2023-11-25 | Discharge: 2023-11-25 | Disposition: A | Discharge status: Home / Self Care | Payer: Medicare Other

## 2023-11-25 ENCOUNTER — Emergency Department (HOSPITAL_BASED_OUTPATIENT_CLINIC_OR_DEPARTMENT_OTHER)
Admission: EM | Admit: 2023-11-25 | Discharge: 2023-11-25 | Disposition: A | Discharge status: Home / Self Care | Payer: Medicare Other | Attending: Emergency Medicine | Admitting: Emergency Medicine

## 2023-11-25 ENCOUNTER — Other Ambulatory Visit: Payer: Self-pay

## 2023-11-25 ENCOUNTER — Encounter (HOSPITAL_BASED_OUTPATIENT_CLINIC_OR_DEPARTMENT_OTHER): Payer: Self-pay

## 2023-11-25 ENCOUNTER — Encounter: Payer: Self-pay | Admitting: Emergency Medicine

## 2023-11-25 DIAGNOSIS — Z23 Encounter for immunization: Secondary | ICD-10-CM | POA: Diagnosis not present

## 2023-11-25 DIAGNOSIS — S01111A Laceration without foreign body of right eyelid and periocular area, initial encounter: Secondary | ICD-10-CM

## 2023-11-25 DIAGNOSIS — Z7982 Long term (current) use of aspirin: Secondary | ICD-10-CM | POA: Diagnosis not present

## 2023-11-25 DIAGNOSIS — S0181XA Laceration without foreign body of other part of head, initial encounter: Secondary | ICD-10-CM | POA: Diagnosis present

## 2023-11-25 DIAGNOSIS — W101XXA Fall (on)(from) sidewalk curb, initial encounter: Secondary | ICD-10-CM | POA: Diagnosis not present

## 2023-11-25 DIAGNOSIS — S0990XA Unspecified injury of head, initial encounter: Secondary | ICD-10-CM

## 2023-11-25 DIAGNOSIS — Y92480 Sidewalk as the place of occurrence of the external cause: Secondary | ICD-10-CM | POA: Diagnosis not present

## 2023-11-25 DIAGNOSIS — Y9301 Activity, walking, marching and hiking: Secondary | ICD-10-CM | POA: Diagnosis not present

## 2023-11-25 MED ORDER — TETANUS-DIPHTH-ACELL PERTUSSIS 5-2.5-18.5 LF-MCG/0.5 IM SUSY
0.5000 mL | PREFILLED_SYRINGE | Freq: Once | INTRAMUSCULAR | Status: AC
  Administered 2023-11-25: 0.5 mL via INTRAMUSCULAR
  Filled 2023-11-25: qty 0.5

## 2023-11-25 NOTE — ED Triage Notes (Signed)
Pt presents after falling about 1 hr ago. He fell and hit his head on crub

## 2023-11-25 NOTE — ED Provider Notes (Signed)
[facility] Provider Note   CSN: [id number] Arrival date & time: 11/25/23  2013     History  Chief Complaint  Patient presents with   Facial Laceration    Vincent Jensen is a 83 y.o. male.  Patient is a 83 year old male who presents after a fall.  He was walking on the sidewalk and tripped on a curb.  He fell forward, striking his left forehead on the pavement.  He had no loss of consciousness.  He is not anticoagulants.  He has a laceration to the area.  No nausea or vomiting.  He has an abrasion on his right hand but denies any pain in the area or other injuries.  No neck or back pain.  He is not sure when his last tetanus shot was.      Home Medications Prior to Admission medications   Medication Sig Start Date End Date Taking? Authorizing Provider  acetaminophen (TYLENOL) 500 MG tablet Take 500 mg by mouth every 6 (six) hours as needed (pain).     [provider]  Ascorbic Acid (VITAMIN C PO) Take 1 tablet by mouth every other day. Alternate with vitamin b complex    [provider]  aspirin 81 MG tablet Take 81 mg by mouth as needed.    [provider]  atorvastatin (LIPITOR) 20 MG tablet TAKE 1 TABLET BY MOUTH EVERY DAY 05/09/23   Tolia, Sunit, DO  Cholecalciferol (D-3-5) 125 MCG (5000 UT) capsule Take 5,000 Units by mouth daily.    [provider]  co-enzyme Q-10 30 MG capsule Take 30 mg by mouth 3 (three) times daily.    [provider]  Multiple Vitamin (MULITIVITAMIN WITH MINERALS) TABS Take 1 tablet by mouth daily.    [provider]  nitroGLYCERIN (NITROSTAT) 0.4 MG SL tablet PLACE 1 TABLET UNDER THE TONGUE EVERY 5 MINGUTES AS NEEDED FOR CHEST PAIN. AFTER 2 TABLETS FIVE MINUTES APART GO TO NEAREST ER VIA EMS. 08/07/20   Tolia, Sunit, DO  tamsulosin (FLOMAX) 0.4 MG CAPS capsule Take 0.4 mg by mouth every evening.  08/02/13   [provider]      Allergies     Patient has no known allergies.    Review of Systems   Review of Systems  Constitutional:  Negative for fatigue.  HENT:  Negative for nosebleeds.   Respiratory:  Negative for shortness of breath.   Gastrointestinal:  Negative for nausea and vomiting.  Musculoskeletal:  Negative for arthralgias, back pain and neck pain.  Skin:  Positive for wound.  Neurological:  Negative for headaches.    Physical Exam Updated Vital Signs BP 131/75 (BP Location: Right Arm)   Pulse 62   Temp 97.9 F (36.6 C) (Oral)   Resp 20   Ht 5\' 8"  (1.727 m)   Wt 72.6 kg   SpO2 97%   BMI 24.33 kg/m  Physical Exam HENT:     Head:     Comments: 1.5 cm laceration to the right forehead above the eyebrow.  It does not go through the eyebrow.  No active bleeding.    Nose:     Comments: No tenderness to the facial bones. Eyes:     Extraocular Movements: Extraocular movements intact.     Pupils: Pupils are equal, round, and reactive to light.  Neck:     Comments: No pain to the cervical, thoracic or lumbosacral spine Cardiovascular:     Rate and Rhythm: Normal  rate.  Pulmonary:     Effort: Pulmonary effort is normal.     Breath sounds: Normal breath sounds.  Abdominal:     General: Abdomen is flat. There is no distension.     Tenderness: There is no abdominal tenderness.  Musculoskeletal:     Comments: No pain on palpation or range of motion of the extremities  Skin:    General: Skin is warm and dry.  Neurological:     General: No focal deficit present.     Mental Status: He is alert and oriented to person, place, and time.    ED Results / Procedures / Treatments   Labs (all labs ordered are listed, but only abnormal results are displayed) Labs Reviewed - No data to display  EKG None  Radiology No results found.  Procedures .Laceration Repair  Date/Time: 11/25/2023 9:44 PM  Performed by: Rolan Bucco, MD Authorized by: Rolan Bucco, MD   Consent:    Consent obtained:  Verbal    Consent given by:  Patient   Risks discussed:  Poor cosmetic result and poor wound healing Anesthesia:    Anesthesia method:  None Laceration details:    Location:  Face   Face location:  Forehead   Length (cm):  1.5 Pre-procedure details:    Preparation:  Patient was prepped and draped in usual sterile fashion and imaging obtained to evaluate for foreign bodies Exploration:    Imaging outcome: foreign body not noted     Wound exploration: wound explored through full range of motion and entire depth of wound visualized     Wound extent: fascia not violated, no foreign body, no underlying fracture and no vascular damage     Contaminated: no   Treatment:    Area cleansed with:  Saline   Amount of cleaning:  Standard   Irrigation solution:  Sterile saline   Irrigation method:  Syringe   Visualized foreign bodies/material removed: no   Skin repair:    Repair method:  Steri-Strips   Number of Steri-Strips:  4 Approximation:    Approximation:  Close Repair type:    Repair type:  Simple Post-procedure details:    Dressing:  Open (no dressing)   Procedure completion:  Tolerated well, no immediate complications     Medications Ordered in ED Medications  Tdap (BOOSTRIX) injection 0.5 mL (0.5 mLs Intramuscular Given 11/25/23 2054)    ED Course/ Medical Decision Making/ A&P                                 Medical Decision Making Amount and/or Complexity of Data Reviewed Radiology: ordered.  Risk Prescription drug management.   Patient presents after mechanical fall.  He has a laceration to his forehead.  He is otherwise well-appearing.  No vomiting.  No neck or back pain.  No concussion symptoms.  He had a head CT which did not show any evidence of intracranial hemorrhage or other acute abnormality.  CT of his cervical spine did not show any acute abnormality.  He does not report any other injuries.  He has a laceration to his forehead.  Sutures were indicated but patient is  refusing sutures at this point.  He is amenable to Steri-Strips.  The wound was cleaned and closed with Steri-Strips.  His tetanus shot was updated.  He was discharged home in good condition.  Wound care instructions were given.  Return precautions were given.  Final  Clinical Impression(s) / ED Diagnoses Final diagnoses:  Injury of head, initial encounter  Laceration of forehead, initial encounter    Rx / DC Orders ED Discharge Orders     None         Rolan Bucco, MD 11/25/23 2223

## 2023-11-25 NOTE — ED Triage Notes (Signed)
Patient arrives POV with wife after falling on sidewalk on right side of face with laceration above right eyebrow. Patient denies hitting head or LOC. Patient states he is not on any blood thinners. Patient is a&o x4.

## 2023-11-26 NOTE — ED Provider Notes (Signed)
 Patient presents to urgent care for evaluation after fall with head injury that happened approximately 1 hour ago. He is wearing a post-operative shoe on the right foot due to recent foot surgery. He tripped over the shoe causing him to fall face forward striking his head on a curb. Injury resulted right eyebrow laceration. He did not pass out and did not become nauseous/vomit after injury. He does not take blood thinners and has been neurologically intact to baseline per significant other since injury.   Per guidelines, patient would benefit from CT imaging of the head and neck due to fall with head injury in older adult.   Discussed clinical concerns/exam findings leading to recommendation for further workup in the ER setting and risks of deferring ER visit with patient/family. Patient/family express understanding and agreement with plan, discharged to ER via personal vehicle with significant other.    Carlisle Beers, [place] 11/26/23 2026

## 2023-12-05 ENCOUNTER — Ambulatory Visit: Payer: Medicare Other | Attending: Cardiology | Admitting: Cardiology

## 2023-12-06 ENCOUNTER — Ambulatory Visit: Payer: Medicare Other | Attending: Cardiology | Admitting: Cardiology

## 2023-12-06 VITALS — BP 118/74 | HR 71 | Resp 16 | Ht 68.0 in | Wt 156.0 lb

## 2023-12-06 DIAGNOSIS — Z87891 Personal history of nicotine dependence: Secondary | ICD-10-CM | POA: Diagnosis present

## 2023-12-06 DIAGNOSIS — I2584 Coronary atherosclerosis due to calcified coronary lesion: Secondary | ICD-10-CM

## 2023-12-06 DIAGNOSIS — I209 Angina pectoris, unspecified: Secondary | ICD-10-CM | POA: Insufficient documentation

## 2023-12-06 DIAGNOSIS — E782 Mixed hyperlipidemia: Secondary | ICD-10-CM | POA: Diagnosis present

## 2023-12-06 DIAGNOSIS — I2510 Atherosclerotic heart disease of native coronary artery without angina pectoris: Secondary | ICD-10-CM

## 2023-12-06 MED ORDER — NITROGLYCERIN 0.4 MG SL SUBL: 0.4000 mg | SUBLINGUAL_TABLET | SUBLINGUAL | 1 refills | Status: AC | PRN

## 2023-12-06 NOTE — Progress Notes (Signed)
 Cardiology Office Note:  .   IDArcangel, Minion 1941/06/09, MRN [id number] PCP:  Adrian Prince, MD  Former Cardiology Providers: NA [facility] Providers Cardiologist:  Tessa Lerner, DO , [facility] (established care 07/31/2020) Electrophysiologist:  None  Click to update primary MD,subspecialty MD or APP then REFRESH:1}    Chief Complaint  Patient presents with   Coronary atherosclerosis due to calcified coronary lesion   Follow-up    History of Present Illness: .   Vincent Jensen is a 83 y.o.  male whose past medical history and cardiovascular risk factors includes: Moderate coronary artery calcification, nonobstructive CAD per coronary CT FFR, hyperlipidemia, former smoker, advanced age.   Referred to the practice for coronary artery calcification and throat tightness.  Since establishing care he has undergone ischemic workup including a coronary CTA which noted moderate CAC and nonobstructive disease.  CT FFR did not illustrate any significant stenosis.  Patient overall has done well from a cardiovascular standpoint since his last coronary CTA in 2021.  He presents today for 1 year follow-up visit.  He denies anginal chest pain or heart failure symptoms.  Overall functional capacity is limited due to the recent foot surgery.  He also had a fall for which he went to the ED on 11/25/2023.  Review of Systems: .   Review of Systems  Cardiovascular:  Negative for chest pain, claudication, irregular heartbeat, leg swelling, near-syncope, orthopnea, palpitations, paroxysmal nocturnal dyspnea and syncope.  Respiratory:  Negative for shortness of breath.   Hematologic/Lymphatic: Negative for bleeding problem.    Studies Reviewed:   EKG: EKG Interpretation Date/Time:  Tuesday December 06 2023 13:59:15 EST Ventricular Rate:  71 PR Interval:  154 QRS Duration:  106 QT Interval:  396 QTC Calculation: 430 R Axis:   -50  Text Interpretation: Normal sinus rhythm  Incomplete right bundle branch block Left anterior fascicular block Moderate voltage criteria for LVH, may be normal variant ( R in aVL , Cornell product ) Septal infarct , age undetermined When compared with ECG of 07-Dec-2011 21:45, Consider Septal infarct is now Present Confirmed by Tessa Lerner [phone]) on 12/06/2023 2:23:45 PM  Echocardiogram: 11/19/2020: Left ventricle cavity is normal in size. Mild concentric hypertrophy of the left ventricle. Normal global wall motion. Normal LV systolic function with EF 55%.  Doppler evidence of grade I (impaired) diastolic dysfunction, normal LAP. Calculated EF 55%. Left atrial cavity is mildly dilated. Right atrial cavity is mildly dilated. Mild to moderate mitral regurgitation. Mild tricuspid regurgitation. Estimated pulmonary artery systolic pressure 31 mmHg.   Stress Testing: No results found for this or any previous visit from the past 1095 days.   08/13/2020 CCTA:  Coronary calcium score of 363 AU. This was 52th percentile for age and sex matched control.  Normal coronary origin with right dominance.  CAD-RADS = 3. CT Mild stenosis in the proximal to mid LAD, Moderate stenosis is first diagonal branch, Mild stenosis in mid LCX, Moderate stenosis in mid RCA.  Mid ascending aorta measures 37mm, upper limit of normal, at the level of the PA bifurcation.  Mild Aortic atherosclerosis.  CT FFR analysis showed no significant stenosis.  RECOMMENDATIONS: CAD-RADS 3: Moderate stenosis. Consider symptom-guided anti-ischemic pharmacotherapy as well as risk factor modification per guideline directed care.  RADIOLOGY: CT Head w/o contrast:  11/25/2023 No evidence of acute abnormality intracranially or in the cervical spine.  Risk Assessment/Calculations:   NA  Labs:    No new labs to review.  Physical Exam:  Today's Vitals   12/06/23 1357  BP: 118/74  Pulse: 71  Resp: 16  SpO2: 97%  Weight: 156 lb (70.8 kg)  Height: 5\' 8"  (1.727 m)   Body  mass index is 23.72 kg/m. Wt Readings from Last 3 Encounters:  12/06/23 156 lb (70.8 kg)  11/25/23 160 lb (72.6 kg)  11/25/22 160 lb 12.8 oz (72.9 kg)    Physical Exam  Constitutional: No distress.  hemodynamically stable  Neck: No JVD present.  Cardiovascular: Normal rate, regular rhythm, S1 normal and S2 normal. Exam reveals no gallop, no S3 and no S4.  No murmur heard. Pulmonary/Chest: Effort normal and breath sounds normal. No stridor. He has no wheezes. He has no rales.  Musculoskeletal:        General: No edema.     Cervical back: Neck supple.     Comments: Prior surgery to right foot  Skin: Skin is warm.     Impression & Recommendation(s):  Impression:   ICD-10-CM   1. Coronary atherosclerosis due to calcified coronary lesion  I25.10 EKG 12-Lead   I25.84 nitroGLYCERIN (NITROSTAT) 0.4 MG SL tablet    ECHOCARDIOGRAM COMPLETE    2. Nonobstructive atherosclerosis of coronary artery  I25.10 nitroGLYCERIN (NITROSTAT) 0.4 MG SL tablet    ECHOCARDIOGRAM COMPLETE    3. Mixed hyperlipidemia  E78.2     4. Former smoker  Z87.891        Recommendation(s):  Coronary atherosclerosis due to calcified coronary lesion Nonobstructive atherosclerosis of coronary artery Chronic and stable Total CAC 363, 52nd percentile. CAD RADS 3 disease-moderate nonobstructive disease Denies anginal chest pain. EKG is nonischemic. Continue aspirin 81 mg p.o. daily Continue atorvastatin 20 mg p.o. daily. Echo will be ordered to evaluate for structural heart disease and left ventricular systolic function. Refill sublingual nitroglycerin tablets  Mixed hyperlipidemia Chronic and stable Currently on Lipitor 20 mg p.o. daily.   He denies myalgia or other side effects. No recent labs available for review.  Patient patient states that he will have it rechecked with PCP and send [place] a copy for reference.  Cardiology is following peripherally.  Orders Placed:  Orders Placed This Encounter   Procedures   EKG 12-Lead   ECHOCARDIOGRAM COMPLETE    Standing Status:   Future    Expected Date:   12/13/2023    Expiration Date:   12/05/2024    Where should this test be performed:   [facility])    Does the patient weigh less than or greater than 250 lbs?:   Patient weighs less than 250 lbs    Perflutren DEFINITY (image enhancing agent) should be administered unless hypersensitivity or allergy exist:   Administer Perflutren    Reason for exam-Echo:   Other-Full Diagnosis List    Full ICD-10/Reason for Exam:   CAD (coronary artery disease) [161096]   I discussed management of least 2 chronic comorbid conditions, prescription drug management, ordered and independently reviewed EKG, ordered echocardiogram, reviewed the results of the coronary CTA for medical decision making.  Final Medication List:    Meds ordered this encounter  Medications   nitroGLYCERIN (NITROSTAT) 0.4 MG SL tablet    Sig: Place 1 tablet (0.4 mg total) under the tongue every 5 (five) minutes as needed for chest pain (Maximum of 3 tablets for one chest pain episode).    Dispense:  25 tablet    Refill:  1    Medications Discontinued During This Encounter  Medication Reason   acetaminophen (TYLENOL)  500 MG tablet    co-enzyme Q-10 30 MG capsule    nitroGLYCERIN (NITROSTAT) 0.4 MG SL tablet Reorder     Current Outpatient Medications:    Ascorbic Acid (VITAMIN C PO), Take 1 tablet by mouth every other day. Alternate with vitamin b complex, Disp: , Rfl:    aspirin 81 MG tablet, Take 81 mg by mouth as needed., Disp: , Rfl:    atorvastatin (LIPITOR) 20 MG tablet, TAKE 1 TABLET BY MOUTH EVERY DAY, Disp: 90 tablet, Rfl: 2   Cholecalciferol (D-3-5) 125 MCG (5000 UT) capsule, Take 5,000 Units by mouth daily., Disp: , Rfl:    Multiple Vitamin (MULITIVITAMIN WITH MINERALS) TABS, Take 1 tablet by mouth daily., Disp: , Rfl:    tamsulosin (FLOMAX) 0.4 MG CAPS capsule, Take 0.4 mg by mouth every evening.  , Disp: , Rfl:    nitroGLYCERIN (NITROSTAT) 0.4 MG SL tablet, Place 1 tablet (0.4 mg total) under the tongue every 5 (five) minutes as needed for chest pain (Maximum of 3 tablets for one chest pain episode)., Disp: 25 tablet, Rfl: 1  Consent:   NA  Disposition:   1 year sooner if needed.  His questions and concerns were addressed to his satisfaction. He voices understanding of the recommendations provided during this encounter.    Signed, Tessa Lerner, DO, [facility]  [facility]  [address] [place], [place] [address] 12/11/2023 9:01 AM

## 2023-12-06 NOTE — Patient Instructions (Signed)
 Medication Instructions:  Your physician recommends that you continue on your current medications as directed. Please refer to the Current Medication list given to you today.  Refill for nitroglycerin has been sent in to your pharmacy.   *If you need a refill on your cardiac medications before your next appointment, please call your pharmacy*  Lab Work: None ordered today. If you have labs (blood work) drawn today and your tests are completely normal, you will receive your results only by: MyChart Message (if you have MyChart) OR A paper copy in the mail If you have any lab test that is abnormal or we need to change your treatment, we will call you to review the results.  Testing/Procedures: Your physician has requested that you have an echocardiogram. Echocardiography is a painless test that uses sound waves to create images of your heart. It provides your doctor with information about the size and shape of your heart and how well your heart's chambers and valves are working. This procedure takes approximately one hour. There are no restrictions for this procedure. Please do NOT wear cologne, perfume, aftershave, or lotions (deodorant is allowed). Please arrive 15 minutes prior to your appointment time.  Please note: We ask at that you not bring children with you during ultrasound (echo/ vascular) testing. Due to room size and safety concerns, children are not allowed in the ultrasound rooms during exams. Our front office staff cannot provide observation of children in our lobby area while testing is being conducted. An adult accompanying a patient to their appointment will only be allowed in the ultrasound room at the discretion of the ultrasound technician under special circumstances. We apologize for any inconvenience.   Follow-Up: At [facility], you and your health needs are our priority.  As part of our continuing mission to provide you with exceptional heart care, we have created  designated Provider Care Teams.  These Care Teams include your primary Cardiologist (physician) and Advanced Practice Providers (APPs -  Physician Assistants and Nurse Practitioners) who all work together to provide you with the care you need, when you need it.  Your next appointment:   1 year(s)  The format for your next appointment:   In Person  Provider:   Tessa Lerner, DO {

## 2023-12-11 ENCOUNTER — Encounter: Payer: Self-pay | Admitting: Cardiology

## 2023-12-28 ENCOUNTER — Ambulatory Visit (HOSPITAL_COMMUNITY): Payer: Medicare Other | Attending: Cardiology

## 2023-12-28 DIAGNOSIS — I2584 Coronary atherosclerosis due to calcified coronary lesion: Secondary | ICD-10-CM | POA: Diagnosis present

## 2023-12-28 DIAGNOSIS — I2510 Atherosclerotic heart disease of native coronary artery without angina pectoris: Secondary | ICD-10-CM | POA: Insufficient documentation

## 2023-12-28 LAB — ECHOCARDIOGRAM COMPLETE
Area-P 1/2: 2.64 cm2
S' Lateral: 2.6 cm

## 2024-01-04 ENCOUNTER — Encounter: Payer: Self-pay | Admitting: Cardiology

## 2024-01-25 ENCOUNTER — Telehealth (HOSPITAL_COMMUNITY): Payer: Self-pay

## 2024-01-25 NOTE — Telephone Encounter (Signed)
 Attempted to contact the patient to schedule VAS [place]. No answer. Left message. First Attempt Provided direct contact number for scheduling: [phone].

## 2024-02-10 ENCOUNTER — Other Ambulatory Visit: Payer: Self-pay | Admitting: Cardiology

## 2024-02-10 DIAGNOSIS — I2510 Atherosclerotic heart disease of native coronary artery without angina pectoris: Secondary | ICD-10-CM

## 2024-04-03 ENCOUNTER — Other Ambulatory Visit (HOSPITAL_COMMUNITY): Payer: Self-pay | Admitting: Endocrinology

## 2024-04-03 DIAGNOSIS — R0989 Other specified symptoms and signs involving the circulatory and respiratory systems: Secondary | ICD-10-CM

## 2024-04-05 ENCOUNTER — Ambulatory Visit (HOSPITAL_COMMUNITY)
Admission: RE | Admit: 2024-04-05 | Discharge: 2024-04-05 | Disposition: A | Discharge status: Home / Self Care | Source: Ambulatory Visit | Attending: Vascular Surgery | Admitting: Vascular Surgery

## 2024-04-05 DIAGNOSIS — R0989 Other specified symptoms and signs involving the circulatory and respiratory systems: Secondary | ICD-10-CM | POA: Diagnosis present

## 2024-04-05 LAB — VAS US ABI WITH/WO TBI
Left ABI: 1.36
Right ABI: 1.27

## 2024-09-09 NOTE — Progress Notes (Unsigned)
 [facility] Telephone:([phone]) [phone]   [phone]  INITIAL CONSULT NOTE  Patient Care Team: Nichole Senior, MD as PCP - General (Endocrinology) Michele Richardson, DO as PCP - Cardiology (Cardiology)  Hematological/Oncological History # Leukocytosis, Neutrophilic Predominate  # PNH/Aplastic Anemia  10/13/2011: received ATG (Horse Antithymocyte Globulin 40mg /kg x 4 doses) and Cyclosporine   06/20/13: Cyclosporine  discontinued  08/03/2024: WBC 24.50, Hgb 13.0, MCV 85.3, Plt 290 09/10/2024: establish care with Dr. Federico   CHIEF COMPLAINTS/PURPOSE OF CONSULTATION:  Leukocytosis   HISTORY OF PRESENTING ILLNESS:  Vincent Jensen 83 y.o. male with medical history significant for PNH/aplastic anemia, diverticulosis, hyperlipidemia, and prior blood transfusion who presents for evaluation of leukocytosis.  On review of the previous records Vincent Jensen was evaluated for PNH and severe aplastic anemia at [facility].  He was treated with horse ATG and cyclosporine  with the cyclosporine  having been stopped in September 2014.  He appears to have last been seen on 01/14/2016 by Dr. Jacqulyne Dine under the supervision of Dr. Floy.  It appears he was lost to follow-up at that time.  Most recently he had labs drawn on 08/03/2024 which showed white blood cell count 24.5, hemoglobin 13.0, MCV 85.3, and platelets of 290.  Due to concern for these findings the patient was referred to hematology for further evaluation and management.   On exam today Vincent Jensen reports that he has been well recently with no recent infections.  He notes that he does have a bunion on his right foot and that it can be erythematous and irritated.  He reports that he does not have any known inflammatory disorders but does have osteoarthritis.  He is a former smoker having quit in 1973 and does not have any signs or symptoms concerning for OSA such as snoring, daytime sleepiness, or sudden awakenings.   He reports he does have poor sleep that is mostly due to him waking up to urinate.  He is quite active and plays soccer.  He reports that his father died of stomach cancer that spread to the lymph nodes.  He has a healthy sister and a mother who passed away of myasthenia gravis.  He has a son who died of suicide and a healthy daughter.  He reports that he did drink alcohol  in the past but quit in 1994.  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  Otherwise he denies any fevers, chills, sweats, nausea, vomiting or diarrhea.  Full 10 point ROS is otherwise negative.  MEDICAL HISTORY:  Past Medical History:  Diagnosis Date   Aplastic anemia    Arthritis    Diverticulosis    History of blood transfusion    Hyperlipidemia    PNH (paroxysmal nocturnal hemoglobinuria) (HCC)    Prostate nodule - left lobe 08/29/2013   DRE 08/29/2013     SURGICAL HISTORY: Past Surgical History:  Procedure Laterality Date   APPENDECTOMY     COLONOSCOPY     JOINT REPLACEMENT     KNEE SURGERY Bilateral    knee replacement surg/Bil   SHOULDER ARTHROSCOPY Left    left shoulder /replacement surg   TONSILLECTOMY     TOTAL HIP ARTHROPLASTY Bilateral    Bil   TOTAL SHOULDER ARTHROPLASTY Right 09/12/2014   Procedure: RIGHT TOTAL SHOULDER ARTHROPLASTY;  Surgeon: Franky CHRISTELLA Pointer, MD;  Location: [facility] OR;  Service: Orthopedics;  Laterality: Right;    SOCIAL HISTORY: Social History   Socioeconomic History  Marital status: Divorced    Spouse name: Not on file   Number of children: 2   Years of education: Not on file   Highest education level: Not on file  Occupational History   Not on file  Tobacco Use   Smoking status: Former    Current packs/day: 0.00    Average packs/day: 1 pack/day for 10.0 years (10.0 ttl pk-yrs)    Types: Cigarettes    Start date: 01/01/1962    Quit date: 01/02/1972    Years since quitting: 52.7   Smokeless tobacco:  Never  Vaping Use   Vaping status: Never Used  Substance and Sexual Activity   Alcohol  use: No   Drug use: No   Sexual activity: Not Currently  Other Topics Concern   Not on file  Social History Narrative   Not on file   Social Drivers of Health   Financial Resource Strain: Low Risk  (01/09/2024)   Received from [facility]   Overall Financial Resource Strain (CARDIA)    Difficulty of Paying Living Expenses: Not hard at all  Food Insecurity: No Food Insecurity (09/10/2024)   Hunger Vital Sign    Worried About Running Out of Food in the Last Year: Never true    Ran Out of Food in the Last Year: Never true  Transportation Needs: No Transportation Needs (01/09/2024)   Received from [facility] - Transportation    Lack of Transportation (Medical): No    Lack of Transportation (Non-Medical): No  Physical Activity: Sufficiently Active (01/09/2024)   Received from [facility]   Exercise Vital Sign    On average, how many days per week do you engage in moderate to strenuous exercise (like a brisk walk)?: 7 days    On average, how many minutes do you engage in exercise at this level?: 60 min  Stress: No Stress Concern Present (01/09/2024)   Received from [facility] - Occupational Stress Questionnaire    Feeling of Stress : Not at all  Social Connections: Socially Integrated (01/09/2024)   Received from [facility]   Social Network    How would you rate your social network (family, work, friends)?: Good participation with social networks  Intimate Partner Violence: Not At Risk (09/10/2024)   Humiliation, Afraid, Rape, and Kick questionnaire    Fear of Current or Ex-Partner: No    Emotionally Abused: No    Physically Abused: No    Sexually Abused: No    FAMILY HISTORY: Family History  Problem Relation Age of Onset   Myasthenia gravis Mother    Cancer Father    Heart disease Maternal Aunt    Heart disease Maternal  Uncle     ALLERGIES:  has no known allergies.  MEDICATIONS:  Current Outpatient Medications  Medication Sig Dispense Refill   Ascorbic Acid (VITAMIN C PO) Take 1 tablet by mouth every other day. Alternate with vitamin b complex     aspirin  81 MG tablet Take 81 mg by mouth as needed.     atorvastatin  (LIPITOR) 20 MG tablet TAKE 1 TABLET BY MOUTH EVERY DAY 90 tablet 3   Cholecalciferol (D-3-5) 125 MCG (5000 UT) capsule Take 5,000 Units by mouth daily.     Multiple Vitamin (MULITIVITAMIN WITH MINERALS) TABS Take 1 tablet by mouth daily.     nitroGLYCERIN  (NITROSTAT ) 0.4 MG SL tablet Place 1 tablet (0.4 mg total) under the tongue every 5 (five) minutes as needed for  chest pain (Maximum of 3 tablets for one chest pain episode). 25 tablet 1   tamsulosin  (FLOMAX ) 0.4 MG CAPS capsule Take 0.4 mg by mouth every evening.      No current facility-administered medications for this visit.    REVIEW OF SYSTEMS:   Constitutional: ( - ) fevers, ( - )  chills , ( - ) night sweats Eyes: ( - ) blurriness of vision, ( - ) double vision, ( - ) watery eyes Ears, nose, mouth, throat, and face: ( - ) mucositis, ( - ) sore throat Respiratory: ( - ) cough, ( - ) dyspnea, ( - ) wheezes Cardiovascular: ( - ) palpitation, ( - ) chest discomfort, ( - ) lower extremity swelling Gastrointestinal:  ( - ) nausea, ( - ) heartburn, ( - ) change in bowel habits Skin: ( - ) abnormal skin rashes Lymphatics: ( - ) new lymphadenopathy, ( - ) easy bruising Neurological: ( - ) numbness, ( - ) tingling, ( - ) new weaknesses Behavioral/Psych: ( - ) mood change, ( - ) new changes  All other systems were reviewed with the patient and are negative.  PHYSICAL EXAMINATION:  Vitals:   09/10/24 0916  BP: 130/70  Pulse: 63  Resp: 14  Temp: (!) 97.5 F (36.4 C)  SpO2: 100%   Filed Weights   09/10/24 0916  Weight: 151 lb 14.4 oz (68.9 kg)    GENERAL: well appearing elderly Caucasian male in NAD  SKIN: skin color, texture,  turgor are normal, no rashes or significant lesions EYES: conjunctiva are pink and non-injected, sclera clear LUNGS: clear to auscultation and percussion with normal breathing effort HEART: regular rate & rhythm and no murmurs and no lower extremity edema Musculoskeletal: no cyanosis of digits and no clubbing  PSYCH: alert & oriented x 3, fluent speech NEURO: no focal motor/sensory deficits  LABORATORY DATA:  I have reviewed the data as listed    Latest Ref Rng & Units 08/30/2014    9:12 AM 12/08/2011    8:20 AM 12/08/2011   12:19 AM  CBC  WBC 4.0 - 10.5 K/uL 3.7  6.0    Hemoglobin 13.0 - 17.0 g/dL 86.4  9.2  9.5   Hematocrit 39.0 - 52.0 % 40.6  27.3  28.0   Platelets 150 - 400 K/uL 143  113         Latest Ref Rng & Units 08/13/2020    8:00 AM 08/30/2014    9:12 AM 12/08/2011    8:20 AM  CMP  Glucose 70 - 99 mg/dL  89    BUN 6 - 23 mg/dL  13    Creatinine 9.38 - 1.24 mg/dL 9.19  9.07  9.15   Sodium 137 - 147 mEq/L  142    Potassium 3.7 - 5.3 mEq/L  4.4    Chloride 96 - 112 mEq/L  105    CO2 19 - 32 mEq/L  27    Calcium  8.4 - 10.5 mg/dL  9.5    Total Protein 6.0 - 8.3 g/dL  6.6    Total Bilirubin 0.3 - 1.2 mg/dL  0.5    Alkaline Phos 39 - 117 U/L  79    AST 0 - 37 U/L  18    ALT 0 - 53 U/L  17       ASSESSMENT & PLAN Vincent Jensen 83 y.o. male with medical history significant for PNH/aplastic anemia, diverticulosis, hyperlipidemia, and prior blood transfusion who presents for evaluation of  leukocytosis.  After review of the labs, review of the records, and discussion with the patient the patients findings are most consistent with leukocytosis of unclear etiology in a patient with a history of PNH/aplastic anemia.   Neutrophilic predominant leukocytosis is a common hematological finding with numerous possible etiologies. Possible causes include chronic inflammation, infection (transient or chronic), medication side effect (particularly steroids), myeloproliferative  neoplasm, or idiopathic. The most common cause of chronic inflammation causing leukocytosis is cigarette smoking. Obstructive sleep apnea can also produce a similar pattern of leukocytosis. If there is no clear cause (or if there is an increase in other cell lines) a myeloproliferative neoplasm workup should be conducted with JAK2 w/ reflex and BCR/ABL FISH.  Idiopathic cases should be observed for stability.    # Leukocytosis, Neutrophilic Predominant  --today will order CBC, CMP, and LDH  --inflammatory workup with ESR and CRP  --patient is a nonsmoker and has no signs/symptoms of OSA   --no focal signs concerning for infection.   --will proceed with MPN workup to include JAK2 w/ reflex and BCR/ABL FISH   --If no etiology is clear based off blood work would recommend pursuing bone marrow biopsy given his prior history of bone marrow disease. --RTC pending results of the above studies.  # PNH/Aplastic Anemia -- Admit 10/13/11 for ATG (Horse Antithymocyte Globulin 40mg /kg x 4 doses) and Cyclosporine  (goal serum level ~250)  --06/20/13: Cyclosporine  discontinued   Orders Placed This Encounter  Procedures   CBC with Differential (Cancer Center Only)    Standing Status:   Future    Expiration Date:   09/10/2025   CMP (Cancer Center only)    Standing Status:   Future    Expiration Date:   09/10/2025   Sedimentation rate    Standing Status:   Future    Expiration Date:   09/10/2025   C-reactive protein    Standing Status:   Future    Expiration Date:   09/10/2025   Lactate dehydrogenase (LDH)    Standing Status:   Future    Expiration Date:   09/10/2025   JAK2 V617 reflex CALR/MPL/E12-15    Standing Status:   Future    Expiration Date:   09/10/2025   BCR-ABL1 FISH    Standing Status:   Future    Expiration Date:   09/10/2025   Save Smear for Provider Slide Review    Standing Status:   Future    Expected Date:   09/10/2024    Expiration Date:   09/10/2025    All questions were answered. The  patient knows to call the clinic with any problems, questions or concerns.  A total of more than 60 minutes were spent on this encounter with face-to-face time and non-face-to-face time, including preparing to see the patient, ordering tests and/or medications, counseling the patient and coordination of care as outlined above.   Norleen IVAR Kidney, MD Department of Hematology/Oncology [facility] Phone: [phone] Pager: [phone] Email: [email or web address]  09/10/2024 10:40 AM

## 2024-09-10 ENCOUNTER — Inpatient Hospital Stay: Attending: Hematology and Oncology

## 2024-09-10 ENCOUNTER — Inpatient Hospital Stay: Admitting: Hematology and Oncology

## 2024-09-10 VITALS — BP 130/70 | HR 63 | Temp 97.5°F | Resp 14 | Wt 151.9 lb

## 2024-09-10 DIAGNOSIS — D72825 Bandemia: Secondary | ICD-10-CM

## 2024-09-10 DIAGNOSIS — D72829 Elevated white blood cell count, unspecified: Secondary | ICD-10-CM | POA: Diagnosis not present

## 2024-09-10 DIAGNOSIS — Z87891 Personal history of nicotine dependence: Secondary | ICD-10-CM | POA: Diagnosis not present

## 2024-09-10 LAB — CBC WITH DIFFERENTIAL (CANCER CENTER ONLY)
Abs Immature Granulocytes: 3.2 K/uL — ABNORMAL HIGH (ref 0.00–0.07)
Band Neutrophils: 2 %
Basophils Absolute: 0.6 K/uL — ABNORMAL HIGH (ref 0.0–0.1)
Basophils Relative: 2 %
Eosinophils Absolute: 0 K/uL (ref 0.0–0.5)
Eosinophils Relative: 0 %
HCT: 37.1 % — ABNORMAL LOW (ref 39.0–52.0)
Hemoglobin: 12.3 g/dL — ABNORMAL LOW (ref 13.0–17.0)
Lymphocytes Relative: 14 %
Lymphs Abs: 4.1 K/uL — ABNORMAL HIGH (ref 0.7–4.0)
MCH: 28.8 pg (ref 26.0–34.0)
MCHC: 33.2 g/dL (ref 30.0–36.0)
MCV: 86.9 fL (ref 80.0–100.0)
Metamyelocytes Relative: 8 %
Monocytes Absolute: 0.3 K/uL (ref 0.1–1.0)
Monocytes Relative: 1 %
Myelocytes: 2 %
Neutro Abs: 20.9 K/uL — ABNORMAL HIGH (ref 1.7–7.7)
Neutrophils Relative %: 70 %
Platelet Count: 241 K/uL (ref 150–400)
Promyelocytes Relative: 1 %
RBC: 4.27 MIL/uL (ref 4.22–5.81)
RDW: 16.9 % — ABNORMAL HIGH (ref 11.5–15.5)
Smear Review: NORMAL
WBC Count: 29 K/uL — ABNORMAL HIGH (ref 4.0–10.5)
nRBC: 0.2 % (ref 0.0–0.2)

## 2024-09-10 LAB — CMP (CANCER CENTER ONLY)
ALT: 17 U/L (ref 0–44)
AST: 26 U/L (ref 15–41)
Albumin: 4.2 g/dL (ref 3.5–5.0)
Alkaline Phosphatase: 88 U/L (ref 38–126)
Anion gap: 8 (ref 5–15)
BUN: 19 mg/dL (ref 8–23)
CO2: 27 mmol/L (ref 22–32)
Calcium: 9.4 mg/dL (ref 8.9–10.3)
Chloride: 107 mmol/L (ref 98–111)
Creatinine: 0.78 mg/dL (ref 0.61–1.24)
GFR, Estimated: >60 mL/min (ref >60)
Glucose, Bld: 85 mg/dL (ref 70–99)
Potassium: 4.5 mmol/L (ref 3.5–5.1)
Sodium: 142 mmol/L (ref 135–145)
Total Bilirubin: 0.5 mg/dL (ref 0.0–1.2)
Total Protein: 6.6 g/dL (ref 6.5–8.1)

## 2024-09-10 LAB — SAVE SMEAR(SSMR), FOR PROVIDER SLIDE REVIEW

## 2024-09-10 LAB — C-REACTIVE PROTEIN: CRP: <0.5 mg/dL (ref <1.0)

## 2024-09-10 LAB — LACTATE DEHYDROGENASE: LDH: 419 U/L — ABNORMAL HIGH (ref 105–235)

## 2024-09-10 LAB — SEDIMENTATION RATE: Sed Rate: 11 mm/h (ref 0–16)

## 2024-09-17 LAB — BCR-ABL1 FISH
Cells Analyzed: 200
Cells Counted: 200

## 2024-09-24 LAB — JAK2 V617F RFX CALR/MPL/E12-15

## 2024-09-24 LAB — CALR +MPL + E12-E15  (REFLEX)

## 2024-10-02 ENCOUNTER — Other Ambulatory Visit: Payer: Self-pay | Admitting: Hematology and Oncology

## 2024-10-02 ENCOUNTER — Telehealth: Payer: Self-pay

## 2024-10-02 DIAGNOSIS — D72825 Bandemia: Secondary | ICD-10-CM

## 2024-10-02 NOTE — Telephone Encounter (Signed)
 Telephone call to patient to relay the following message from Dr Federico: His bloodwork did not show any clear cause for his high WBC. At this time I would recommend a bone marrow biopsy to further assess. The order has been placed, I would anticipate the biopsy will be in the next 1-2 weeks.  Received VM, left message and notified pt to call us  back about Dr Lafonda recommendation.

## 2024-10-02 NOTE — Telephone Encounter (Signed)
 Pt called with concern regarding lab results from 09/10/24. Notified pt I would speak with Dr Federico and call him back, pt verbalized understanding. Message sent to Dr Federico to advise.

## 2024-10-08 ENCOUNTER — Other Ambulatory Visit: Payer: Self-pay

## 2024-10-08 DIAGNOSIS — Z01818 Encounter for other preprocedural examination: Secondary | ICD-10-CM

## 2024-10-09 ENCOUNTER — Other Ambulatory Visit (HOSPITAL_COMMUNITY): Payer: Self-pay | Admitting: Radiology

## 2024-10-10 ENCOUNTER — Other Ambulatory Visit: Payer: Self-pay | Admitting: Radiology

## 2024-10-10 NOTE — H&P (Shared)
 "    Chief Complaint: leukocytosis, unclear etiology; referred for bone marrow biopsy for further evaluation   Referring Provider(s): Dorsey,John T IV, MD  Supervising Physician: Philip Cornet  Patient Status: [facility] - Out-pt  History of Present Illness: Vincent Jensen is an 83 y.o. male with past medical history significant for HLD, diverticulosis, and aplastic anemia. He was receiving care for paroxysmal nocturnal hemoglobinuria and aplastic anemia at [facility] until follow up was lost after his last appointment 01/14/2016. Now he sees Dr. Federico with hematology/oncology for leukocytosis. On 12/1, WBC 29 and Hgb 12.3. Received request for image guided bone marrow biopsy and aspiration as part of work up.    Allergies Reviewed:  Patient has no known allergies.   Patient is Full Code  Past Medical History:  Diagnosis Date   Aplastic anemia    Arthritis    Diverticulosis    History of blood transfusion    Hyperlipidemia    PNH (paroxysmal nocturnal hemoglobinuria) (HCC)    Prostate nodule - left lobe 08/29/2013   DRE 08/29/2013     Past Surgical History:  Procedure Laterality Date   APPENDECTOMY     COLONOSCOPY     JOINT REPLACEMENT     KNEE SURGERY Bilateral    knee replacement surg/Bil   SHOULDER ARTHROSCOPY Left    left shoulder /replacement surg   TONSILLECTOMY     TOTAL HIP ARTHROPLASTY Bilateral    Bil   TOTAL SHOULDER ARTHROPLASTY Right 09/12/2014   Procedure: RIGHT TOTAL SHOULDER ARTHROPLASTY;  Surgeon: Franky CHRISTELLA Pointer, MD;  Location: [facility] OR;  Service: Orthopedics;  Laterality: Right;      Medications: Prior to Admission medications  Medication Sig Start Date End Date Taking? Authorizing Provider  Ascorbic Acid (VITAMIN C PO) Take 1 tablet by mouth every other day. Alternate with vitamin b complex    [provider]  aspirin  81 MG tablet Take 81 mg by mouth as needed.    [provider]  atorvastatin  (LIPITOR) 20 MG tablet  TAKE 1 TABLET BY MOUTH EVERY DAY 02/10/24   Tolia, Sunit, DO  Cholecalciferol (D-3-5) 125 MCG (5000 UT) capsule Take 5,000 Units by mouth daily.    [provider]  Multiple Vitamin (MULITIVITAMIN WITH MINERALS) TABS Take 1 tablet by mouth daily.    [provider]  nitroGLYCERIN  (NITROSTAT ) 0.4 MG SL tablet Place 1 tablet (0.4 mg total) under the tongue every 5 (five) minutes as needed for chest pain (Maximum of 3 tablets for one chest pain episode). 12/06/23   Tolia, Sunit, DO  tamsulosin  (FLOMAX ) 0.4 MG CAPS capsule Take 0.4 mg by mouth every evening.  08/02/13   [provider]     Family History  Problem Relation Age of Onset   Myasthenia gravis Mother    Cancer Father    Heart disease Maternal Aunt    Heart disease Maternal Uncle     Social History   Socioeconomic History   Marital status: Divorced    Spouse name: Not on file   Number of children: 2   Years of education: Not on file   Highest education level: Not on file  Occupational History   Not on file  Tobacco Use   Smoking status: Former    Current packs/day: 0.00    Average packs/day: 1 pack/day for 10.0 years (10.0 ttl pk-yrs)    Types: Cigarettes    Start date: 01/01/1962    Quit date: 01/02/1972    Years since  quitting: 52.8   Smokeless tobacco: Never  Vaping Use   Vaping status: Never Used  Substance and Sexual Activity   Alcohol  use: No   Drug use: No   Sexual activity: Not Currently  Other Topics Concern   Not on file  Social History Narrative   Not on file   Social Drivers of Health   Tobacco Use: Medium Risk (05/14/2024)   Received from [facility]   Patient History    Smoking Tobacco Use: Former    Smokeless Tobacco Use: Never    Passive Exposure: Never  Physicist, Medical Strain: Low Risk (01/09/2024)   Received from [facility]   Overall Financial Resource Strain (CARDIA)    Difficulty of Paying Living Expenses: Not hard at all  Food Insecurity: No Food Insecurity  (09/10/2024)   Epic    Worried About Radiation Protection Practitioner of Food in the Last Year: Never true    Ran Out of Food in the Last Year: Never true  Transportation Needs: No Transportation Needs (01/09/2024)   Received from [facility] - Transportation    Lack of Transportation (Medical): No    Lack of Transportation (Non-Medical): No  Physical Activity: Sufficiently Active (01/09/2024)   Received from [facility]   Exercise Vital Sign    On average, how many days per week do you engage in moderate to strenuous exercise (like a brisk walk)?: 7 days    On average, how many minutes do you engage in exercise at this level?: 60 min  Stress: No Stress Concern Present (01/09/2024)   Received from [facility] - Occupational Stress Questionnaire    Feeling of Stress : Not at all  Social Connections: Socially Integrated (01/09/2024)   Received from [facility]   Social Network    How would you rate your social network (family, work, friends)?: Good participation with social networks  Depression (PHQ2-9): Low Risk (09/10/2024)   Depression (PHQ2-9)    PHQ-2 Score: 0  Alcohol  Screen: Not on file  Housing: Unknown (09/10/2024)   Epic    Unable to Pay for Housing in the Last Year: No    Number of Times Moved in the Last Year: Not on file    Homeless in the Last Year: No  Utilities: Not At Risk (09/10/2024)   Epic    Threatened with loss of utilities: No  Health Literacy: Not on file         Vital Signs: Vitals:   10/12/24 0731  BP: 110/74  Pulse: 67  Resp: 18  Temp: 98.3 F (36.8 C)  SpO2: 98%      Advance Care Plan: no documents on file    Physical Exam awake/alert; chest-CTA bilat; heart- RRR; abd-soft,+BS,NT; no LE edema  Imaging: No results found.  Labs:  CBC: Recent Labs    09/10/24 1109  WBC 29.0*  HGB 12.3*  HCT 37.1*  PLT 241    COAGS: No results for input(s): INR, APTT in the last 8760  hours.  BMP: Recent Labs    09/10/24 1109  NA 142  K 4.5  CL 107  CO2 27  GLUCOSE 85  BUN 19  CALCIUM  9.4  CREATININE 0.78  GFRNONAA >60    LIVER FUNCTION TESTS: Recent Labs    09/10/24 1109  BILITOT 0.5  AST 26  ALT 17  ALKPHOS 88  PROT 6.6  ALBUMIN 4.2    TUMOR MARKERS: No results for input(s): AFPTM, CEA,  CA199, CHROMGRNA in the last 8760 hours.  Assessment and Plan: Leukocytosis, unclear etiology-  Request for image guided bone marrow biopsy and aspiration. No contraindications for procedure identified in ROS, physical exam, or review of pre-sedation considerations.   Risks and benefits of image guided bone marrow biopsy and aspiration was discussed with the patient /significant other including, but not limited to bleeding, infection, damage to adjacent structures or low yield requiring additional tests.  All of the questions were answered and there is agreement to proceed.  Consent signed and in chart.   Thank you for allowing our service to participate in Vincent Jensen 's care.    Electronically Signed: Kristi B Davenport, NP Vincent Jensen 10/10/2024, 2:35 PM     I spent a total of 20 minutes in face to face in clinical consultation, greater than 50% of which was counseling/coordinating care for image guided bone marrow biopsy and aspiration.   (A copy of this note was sent to the referring provider and the time of visit.)  "

## 2024-10-12 ENCOUNTER — Other Ambulatory Visit: Payer: Self-pay

## 2024-10-12 ENCOUNTER — Ambulatory Visit (HOSPITAL_COMMUNITY)
Admission: RE | Admit: 2024-10-12 | Discharge: 2024-10-12 | Disposition: A | Discharge status: Home / Self Care | Source: Ambulatory Visit | Attending: Hematology and Oncology | Admitting: Hematology and Oncology

## 2024-10-12 DIAGNOSIS — E785 Hyperlipidemia, unspecified: Secondary | ICD-10-CM | POA: Diagnosis not present

## 2024-10-12 DIAGNOSIS — Z1379 Encounter for other screening for genetic and chromosomal anomalies: Secondary | ICD-10-CM | POA: Insufficient documentation

## 2024-10-12 DIAGNOSIS — D72825 Bandemia: Secondary | ICD-10-CM | POA: Insufficient documentation

## 2024-10-12 DIAGNOSIS — Z01818 Encounter for other preprocedural examination: Secondary | ICD-10-CM

## 2024-10-12 HISTORY — PX: IR BONE MARROW BIOPSY & ASPIRATION: IMG5727

## 2024-10-12 LAB — CBC WITH DIFFERENTIAL/PLATELET
Abs Immature Granulocytes: 7.43 K/uL — ABNORMAL HIGH (ref 0.00–0.07)
Basophils Absolute: 0.3 K/uL — ABNORMAL HIGH (ref 0.0–0.1)
Basophils Relative: 1 %
Eosinophils Absolute: 0.7 K/uL — ABNORMAL HIGH (ref 0.0–0.5)
Eosinophils Relative: 2 %
HCT: 37.4 % — ABNORMAL LOW (ref 39.0–52.0)
Hemoglobin: 12.6 g/dL — ABNORMAL LOW (ref 13.0–17.0)
Immature Granulocytes: 21 %
Lymphocytes Relative: 6 %
Lymphs Abs: 2.3 K/uL (ref 0.7–4.0)
MCH: 29.6 pg (ref 26.0–34.0)
MCHC: 33.7 g/dL (ref 30.0–36.0)
MCV: 87.8 fL (ref 80.0–100.0)
Monocytes Absolute: 0.8 K/uL (ref 0.1–1.0)
Monocytes Relative: 2 %
Neutro Abs: 23.8 K/uL — ABNORMAL HIGH (ref 1.7–7.7)
Neutrophils Relative %: 68 %
Platelets: 249 K/uL (ref 150–400)
RBC: 4.26 MIL/uL (ref 4.22–5.81)
RDW: 16.3 % — ABNORMAL HIGH (ref 11.5–15.5)
Smear Review: NORMAL
WBC: 35.4 K/uL — ABNORMAL HIGH (ref 4.0–10.5)
nRBC: 0.2 % (ref 0.0–0.2)

## 2024-10-12 LAB — PROTIME-INR
INR: 1 (ref 0.8–1.2)
Prothrombin Time: 13.6 s (ref 11.4–15.2)

## 2024-10-12 MED ORDER — FENTANYL CITRATE (PF) 100 MCG/2ML IJ SOLN
INTRAMUSCULAR | Status: AC | PRN
  Administered 2024-10-12: 50 ug via INTRAVENOUS

## 2024-10-12 MED ORDER — LIDOCAINE HCL (PF) 1 % IJ SOLN
INTRAMUSCULAR | Status: AC
  Filled 2024-10-12: qty 30

## 2024-10-12 MED ORDER — LIDOCAINE HCL (PF) 1 % IJ SOLN
20.0000 mL | Freq: Once | INTRAMUSCULAR | Status: AC
  Administered 2024-10-12: 10 mL

## 2024-10-12 MED ORDER — MIDAZOLAM HCL (PF) 2 MG/2ML IJ SOLN
INTRAMUSCULAR | Status: AC | PRN
  Administered 2024-10-12: 1 mg via INTRAVENOUS

## 2024-10-12 MED ORDER — FENTANYL CITRATE (PF) 100 MCG/2ML IJ SOLN
INTRAMUSCULAR | Status: AC
  Filled 2024-10-12: qty 2

## 2024-10-12 MED ORDER — MIDAZOLAM HCL 2 MG/2ML IJ SOLN
INTRAMUSCULAR | Status: AC
  Filled 2024-10-12: qty 2

## 2024-10-12 MED ORDER — SODIUM CHLORIDE 0.9 % IV SOLN: INTRAVENOUS | Status: DC

## 2024-10-12 NOTE — Discharge Instructions (Signed)
Discharge Instructions:   Please call Interventional Radiology clinic [phone] with any questions or concerns.  You may remove your dressing and shower tomorrow.    Bone Marrow Aspiration and Bone Marrow Biopsy, Adult, Care After This sheet gives you information about how to care for yourself after your procedure. Your health care provider may also give you more specific instructions. If you have problems or questions, contact your health care provider. What can I expect after the procedure? After the procedure, it is common to have: Mild pain and tenderness. Swelling. Bruising. Follow these instructions at home: Puncture site care  Follow instructions from your health care provider about how to take care of the puncture site. Make sure you: Wash your hands with soap and water before and after you change your bandage (dressing). If soap and water are not available, use hand sanitizer. Change your dressing as told by your health care provider. Check your puncture site every day for signs of infection. Check for: More redness, swelling, or pain. Fluid or blood. Warmth. Pus or a bad smell. Activity Return to your normal activities as told by your health care provider. Ask your health care provider what activities are safe for you. Do not lift anything that is heavier than 10 lb (4.5 kg), or the limit that you are told, until your health care provider says that it is safe. Do not drive for 24 hours if you were given a sedative during your procedure. General instructions  Take over-the-counter and prescription medicines only as told by your health care provider. Do not take baths, swim, or use a hot tub until your health care provider approves. Ask your health care provider if you may take showers. You may only be allowed to take sponge baths. If directed, put ice on the affected area. To do this: Put ice in a plastic bag. Place a towel between your skin and the bag. Leave the ice  on for 20 minutes, 2-3 times a day. Keep all follow-up visits as told by your health care provider. This is important. Contact a health care provider if: Your pain is not controlled with medicine. You have a fever. You have more redness, swelling, or pain around the puncture site. You have fluid or blood coming from the puncture site. Your puncture site feels warm to the touch. You have pus or a bad smell coming from the puncture site. Summary After the procedure, it is common to have mild pain, tenderness, swelling, and bruising. Follow instructions from your health care provider about how to take care of the puncture site and what activities are safe for you. Take over-the-counter and prescription medicines only as told by your health care provider. Contact a health care provider if you have any signs of infection, such as fluid or blood coming from the puncture site. This information is not intended to replace advice given to you by your health care provider. Make sure you discuss any questions you have with your health care provider. Document Revised: 02/13/2019 Document Reviewed: 02/13/2019 Elsevier Patient Education  2023 Elsevier Inc.   Moderate Conscious Sedation, Adult, Care After This sheet gives you information about how to care for yourself after your procedure. Your health care provider may also give you more specific instructions. If you have problems or questions, contact your health care provider. What can I expect after the procedure? After the procedure, it is common to have: Sleepiness for several hours. Impaired judgment for several hours. Difficulty with balance. Vomiting if   you eat too soon. Follow these instructions at home: For the time period you were told by your health care provider: Rest. Do not participate in activities where you could fall or become injured. Do not drive or use machinery. Do not drink alcohol. Do not take sleeping pills or medicines that  cause drowsiness. Do not make important decisions or sign legal documents. Do not take care of children on your own. Eating and drinking  Follow the diet recommended by your health care provider. Drink enough fluid to keep your urine pale yellow. If you vomit: Drink water, juice, or soup when you can drink without vomiting. Make sure you have little or no nausea before eating solid foods. General instructions Take over-the-counter and prescription medicines only as told by your health care provider. Have a responsible adult stay with you for the time you are told. It is important to have someone help care for you until you are awake and alert. Do not smoke. Keep all follow-up visits as told by your health care provider. This is important. Contact a health care provider if: You are still sleepy or having trouble with balance after 24 hours. You feel light-headed. You keep feeling nauseous or you keep vomiting. You develop a rash. You have a fever. You have redness or swelling around the IV site. Get help right away if: You have trouble breathing. You have new-onset confusion at home. Summary After the procedure, it is common to feel sleepy, have impaired judgment, or feel nauseous if you eat too soon. Rest after you get home. Know the things you should not do after the procedure. Follow the diet recommended by your health care provider and drink enough fluid to keep your urine pale yellow. Get help right away if you have trouble breathing or new-onset confusion at home. This information is not intended to replace advice given to you by your health care provider. Make sure you discuss any questions you have with your health care provider. Document Revised: 01/25/2020 Document Reviewed: 08/23/2019 Elsevier Patient Education  2023 Elsevier Inc.  

## 2024-10-12 NOTE — Procedures (Signed)
 Interventional Radiology Procedure Note  Procedure: Fluoro guided Bone Marrow biopsy (iliac bone)  Complications: None  Estimated Blood Loss: < 10 mL  Findings: Fluoro guided bone marrow biopsy of the left iliac bone.  Aspirate and core samples sent to pathology for further processing.  Vincent Jensen Banner, MD

## 2024-10-12 NOTE — Progress Notes (Signed)
 1000  Ice bag given to use as needed to low back for comfort as instructed.

## 2024-10-17 LAB — SURGICAL PATHOLOGY

## 2024-10-18 ENCOUNTER — Encounter (HOSPITAL_COMMUNITY): Payer: Self-pay

## 2024-10-22 ENCOUNTER — Telehealth: Payer: Self-pay | Admitting: *Deleted

## 2024-10-22 NOTE — Telephone Encounter (Signed)
 Received vm message from pt requesting call back regarding bone marrow biopsy results. Dr. Federico made aware of this request.

## 2024-10-25 ENCOUNTER — Encounter (HOSPITAL_COMMUNITY): Payer: Self-pay | Admitting: Hematology and Oncology

## 2024-10-29 ENCOUNTER — Inpatient Hospital Stay: Attending: Hematology and Oncology | Admitting: Hematology and Oncology

## 2024-10-29 VITALS — BP 136/82 | HR 77 | Temp 98.2°F | Resp 14 | Wt 153.7 lb

## 2024-10-29 DIAGNOSIS — D72829 Elevated white blood cell count, unspecified: Secondary | ICD-10-CM | POA: Diagnosis present

## 2024-10-29 DIAGNOSIS — D72825 Bandemia: Secondary | ICD-10-CM | POA: Diagnosis not present

## 2024-10-29 DIAGNOSIS — Z87891 Personal history of nicotine dependence: Secondary | ICD-10-CM | POA: Insufficient documentation

## 2024-10-29 NOTE — Progress Notes (Unsigned)
 " [facility] [phone]   [phone]  PROGRESS NOTE  Patient Care Team: Nichole Senior, MD as PCP - General (Endocrinology) Michele Richardson, DO as PCP - Cardiology (Cardiology)  Hematological/Oncological History # Leukocytosis, Neutrophilic Predominate  # PNH/Aplastic Anemia  # MDS/MPN Overlap  10/13/2011: received ATG (Horse Antithymocyte Globulin 40mg /kg x 4 doses) and Cyclosporine   06/20/13: Cyclosporine  discontinued  08/03/2024: WBC 24.50, Hgb 13.0, MCV 85.3, Plt 290 09/10/2024: establish care with Dr. Federico  10/12/2024: Bone marrow biopsy showed hypercellular bone marrow secondary to a myeloid hyperplasia, erythroid hypoplasia and mild megakaryocytic hyperplasia   Interval History:  [name] 84 y.o. male with medical history significant for MDS/MPN overlap who presents for a follow up visit. The patient's last visit was on 09/10/2024. In the interim since the last visit he underwent a bone marrow biopsy on 10/12/2024.   On exam today [name] unfortunately has had difficulty contacting our office since his bone marrow biopsy procedure.  Additionally we have left him 2 voicemails which he did not receive.  There has been a unfortunate difficulty with communication and this has left him frustrated.  He reports his bone marrow biopsy procedure went well and that he does not have any residual soreness or pain.  He do not have any trouble with bruising or bleeding.  He reports that he otherwise has been his baseline level of health in the interim since our last visit with no fevers, chills, sweats, nausea, vomiting or diarrhea.  The bulk of our discussion focused on the results of his bone marrow biopsy.  At this time it appears he has an MDS/MPN overlap.  Observation would be the recommended course of action at this time.  He voiced understanding of our findings and plan moving forward.  Details of our conversation are noted below.  MEDICAL HISTORY:   Past Medical History:  Diagnosis Date   Aplastic anemia    Arthritis    Diverticulosis    History of blood transfusion    Hyperlipidemia    PNH (paroxysmal nocturnal hemoglobinuria) (HCC)    Prostate nodule - left lobe 08/29/2013   DRE 08/29/2013     SURGICAL HISTORY: Past Surgical History:  Procedure Laterality Date   APPENDECTOMY     COLONOSCOPY     IR BONE MARROW BIOPSY & ASPIRATION  10/12/2024   JOINT REPLACEMENT     KNEE SURGERY Bilateral    knee replacement surg/Bil   SHOULDER ARTHROSCOPY Left    left shoulder /replacement surg   TONSILLECTOMY     TOTAL HIP ARTHROPLASTY Bilateral    Bil   TOTAL SHOULDER ARTHROPLASTY Right 09/12/2014   Procedure: RIGHT TOTAL SHOULDER ARTHROPLASTY;  Surgeon: Franky CHRISTELLA Pointer, MD;  Location: [facility] OR;  Service: Orthopedics;  Laterality: Right;    SOCIAL HISTORY: Social History   Socioeconomic History   Marital status: Divorced    Spouse name: Not on file   Number of children: 2   Years of education: Not on file   Highest education level: Not on file  Occupational History   Not on file  Tobacco Use   Smoking status: Former    Current packs/day: 0.00    Average packs/day: 1 pack/day for 10.0 years (10.0 ttl pk-yrs)    Types: Cigarettes    Start date: 01/01/1962    Quit date: 01/02/1972    Years since quitting: 52.8   Smokeless tobacco: Never  Vaping Use   Vaping status: Never Used  Substance and Sexual Activity  Alcohol  use: No   Drug use: No   Sexual activity: Not Currently  Other Topics Concern   Not on file  Social History Narrative   Not on file   Social Drivers of Health   Tobacco Use: Medium Risk (05/14/2024)   Received from [facility]   Patient History    Smoking Tobacco Use: Former    Smokeless Tobacco Use: Never    Passive Exposure: Never  Physicist, Medical Strain: Low Risk (01/09/2024)   Received from [facility]   Overall Financial Resource Strain (CARDIA)    Difficulty of Paying Living Expenses: Not  hard at all  Food Insecurity: No Food Insecurity (09/10/2024)   Epic    Worried About Radiation Protection Practitioner of Food in the Last Year: Never true    Ran Out of Food in the Last Year: Never true  Transportation Needs: No Transportation Needs (01/09/2024)   Received from [facility] - Transportation    Lack of Transportation (Medical): No    Lack of Transportation (Non-Medical): No  Physical Activity: Sufficiently Active (01/09/2024)   Received from [facility]   Exercise Vital Sign    On average, how many days per week do you engage in moderate to strenuous exercise (like a brisk walk)?: 7 days    On average, how many minutes do you engage in exercise at this level?: 60 min  Stress: No Stress Concern Present (01/09/2024)   Received from [facility] - Occupational Stress Questionnaire    Feeling of Stress : Not at all  Social Connections: Socially Integrated (01/09/2024)   Received from [facility]   Social Network    How would you rate your social network (family, work, friends)?: Good participation with social networks  Intimate Partner Violence: Not At Risk (09/10/2024)   Epic    Fear of Current or Ex-Partner: No    Emotionally Abused: No    Physically Abused: No    Sexually Abused: No  Depression (PHQ2-9): Low Risk (09/10/2024)   Depression (PHQ2-9)    PHQ-2 Score: 0  Alcohol  Screen: Not on file  Housing: Unknown (09/10/2024)   Epic    Unable to Pay for Housing in the Last Year: No    Number of Times Moved in the Last Year: Not on file    Homeless in the Last Year: No  Utilities: Not At Risk (09/10/2024)   Epic    Threatened with loss of utilities: No  Health Literacy: Not on file    FAMILY HISTORY: Family History  Problem Relation Age of Onset   Myasthenia gravis Mother    Cancer Father    Heart disease Maternal Aunt    Heart disease Maternal Uncle     ALLERGIES:  has no known allergies.  MEDICATIONS:  Current  Outpatient Medications  Medication Sig Dispense Refill   Ascorbic Acid (VITAMIN C PO) Take 1 tablet by mouth every other day. Alternate with vitamin b complex     aspirin  81 MG tablet Take 81 mg by mouth as needed.     atorvastatin  (LIPITOR) 20 MG tablet TAKE 1 TABLET BY MOUTH EVERY DAY 90 tablet 3   Cholecalciferol (D-3-5) 125 MCG (5000 UT) capsule Take 5,000 Units by mouth daily.     Multiple Vitamin (MULITIVITAMIN WITH MINERALS) TABS Take 1 tablet by mouth daily.     nitroGLYCERIN  (NITROSTAT ) 0.4 MG SL tablet Place 1 tablet (0.4 mg total) under the tongue every 5 (five)  minutes as needed for chest pain (Maximum of 3 tablets for one chest pain episode). 25 tablet 1   tamsulosin  (FLOMAX ) 0.4 MG CAPS capsule Take 0.4 mg by mouth every evening.      No current facility-administered medications for this visit.    REVIEW OF SYSTEMS:   Constitutional: ( - ) fevers, ( - )  chills , ( - ) night sweats Eyes: ( - ) blurriness of vision, ( - ) double vision, ( - ) watery eyes Ears, nose, mouth, throat, and face: ( - ) mucositis, ( - ) sore throat Respiratory: ( - ) cough, ( - ) dyspnea, ( - ) wheezes Cardiovascular: ( - ) palpitation, ( - ) chest discomfort, ( - ) lower extremity swelling Gastrointestinal:  ( - ) nausea, ( - ) heartburn, ( - ) change in bowel habits Skin: ( - ) abnormal skin rashes Lymphatics: ( - ) new lymphadenopathy, ( - ) easy bruising Neurological: ( - ) numbness, ( - ) tingling, ( - ) new weaknesses Behavioral/Psych: ( - ) mood change, ( - ) new changes  All other systems were reviewed with the patient and are negative.  PHYSICAL EXAMINATION:  Vitals:   10/29/24 1501  BP: 136/82  Pulse: 77  Resp: 14  Temp: 98.2 F (36.8 C)  SpO2: 97%   Filed Weights   10/29/24 1501  Weight: 153 lb 11.2 oz (69.7 kg)    GENERAL: Well-appearing elderly male, alert, no distress and comfortable SKIN: skin color, texture, turgor are normal, no rashes or significant lesions EYES:  conjunctiva are pink and non-injected, sclera clear LUNGS: clear to auscultation and percussion with normal breathing effort HEART: regular rate & rhythm and no murmurs and no lower extremity edema Musculoskeletal: no cyanosis of digits and no clubbing  PSYCH: alert & oriented x 3, fluent speech NEURO: no focal motor/sensory deficits  LABORATORY DATA:  I have reviewed the data as listed    Latest Ref Rng & Units 10/12/2024    7:35 AM 09/10/2024   11:09 AM 08/30/2014    9:12 AM  CBC  WBC 4.0 - 10.5 K/uL 35.4  29.0  3.7   Hemoglobin 13.0 - 17.0 g/dL 87.3  87.6  86.4   Hematocrit 39.0 - 52.0 % 37.4  37.1  40.6   Platelets 150 - 400 K/uL 249  241  143        Latest Ref Rng & Units 09/10/2024   11:09 AM 08/13/2020    8:00 AM 08/30/2014    9:12 AM  CMP  Glucose 70 - 99 mg/dL 85   89   BUN 8 - 23 mg/dL 19   13   Creatinine 9.38 - 1.24 mg/dL 9.21  9.19  9.07   Sodium 135 - 145 mmol/L 142   142   Potassium 3.5 - 5.1 mmol/L 4.5   4.4   Chloride 98 - 111 mmol/L 107   105   CO2 22 - 32 mmol/L 27   27   Calcium  8.9 - 10.3 mg/dL 9.4   9.5   Total Protein 6.5 - 8.1 g/dL 6.6   6.6   Total Bilirubin 0.0 - 1.2 mg/dL 0.5   0.5   Alkaline Phos 38 - 126 U/L 88   79   AST 15 - 41 U/L 26   18   ALT 0 - 44 U/L 17   17     RADIOGRAPHIC STUDIES: IR BONE MARROW BIOPSY & ASPIRATION Result Date: 10/12/2024  CLINICAL DATA:  Leukocytosis of uncertain etiology. EXAM: Fluoro guided bone marrow biopsy TECHNIQUE: Fluoroscopy CONTRAST:  None RADIOPHARMACEUTICALS:  None FLUOROSCOPY: 5 mGy COMPARISON:  None FINDINGS: The patient was placed in prone position on the CT gantry. Radiopaque markers were placed on the patient's skin and initial imaging of the pelvis was performed. The patient's skin was then prepped and draped in the usual sterile fashion. Moderate sedation was provided for by the nursing staff under my supervision utilizing intravenous Versed  and fentanyl . The nurse had no other duties other than  monitoring the patient and providing sedation during the procedure. I was present for the entire procedure. Moderate sedation was performed using 2 mg intravenous Versed  and 100 mcg intravenous fentanyl  which were administered for a total sedation time of 10 minutes. 1% lidocaine  was used to infiltrate the skin at the access site prior to a stab incision. Local anesthesia was then used to infiltrate the region of soft tissue from the skin to the left iliac bone. The bone marrow needle was then advanced and imaging demonstrated the needle tip to be in the cortex of the left iliac bone. The bone was then penetrated and a sample was obtained. After the sample was evaluated, approximately 5 mL of heparinized bone marrow sample was obtained by aspiration. A core sample was then obtained. Multiple attempts at sampling was performed in order to get 2 1 cm segments. All needles were then removed from the patient. Sterile dressing was applied. IMPRESSION: Satisfactory core needle biopsy and aspiration of the left iliac bone marrow under fluoro guidance. Electronically Signed   By: Cordella Banner   On: 10/12/2024 09:44    ASSESSMENT & PLAN Vincent Jensen 84 y.o. male with medical history significant for MDS/MPN overlap who presents for a follow up visit.  # Leukocytosis, Neutrophilic Predominant  # MDS/MPN Overlap  -- Most recent labs show white blood cell 35.4, hemoglobin 12.6, MCV 87.8, platelets 249 --inflammatory workup with ESR and CRP was negative --patient is a nonsmoker and has no signs/symptoms of OSA   --no focal signs concerning for infection.   -- Negative MPN workup including JAK2 w/ reflex and BCR/ABL FISH   --Bone marrow biopsy showed concern for MPN/MDS overlap --Recommend observation and monitoring, no intervention required at this time --RTC in 3 months for labs in 6 months for clinic visit   # PNH/Aplastic Anemia -- Admit 10/13/11 for ATG (Horse Antithymocyte Globulin 40mg /kg x 4 doses)  and Cyclosporine  (goal serum level ~250)  --06/20/13: Cyclosporine  discontinued   No orders of the defined types were placed in this encounter.   All questions were answered. The patient knows to call the clinic with any problems, questions or concerns.  A total of more than 30 minutes were spent on this encounter with face-to-face time and non-face-to-face time, including preparing to see the patient, ordering tests and/or medications, counseling the patient and coordination of care as outlined above.   Norleen IVAR Kidney, MD Department of Hematology/Oncology [facility] Phone: [phone] Pager: [phone] Email: [email or web address]  10/30/2024 9:18 AM  "

## 2025-01-28 ENCOUNTER — Inpatient Hospital Stay: Attending: Hematology and Oncology

## 2025-04-29 ENCOUNTER — Inpatient Hospital Stay

## 2025-04-29 ENCOUNTER — Inpatient Hospital Stay: Admitting: Hematology and Oncology
